# Patient Record
Sex: Male | Born: 1950 | Race: White | Hispanic: No | Marital: Married | Smoking: Former smoker
Health system: Southern US, Community
[De-identification: ages and names within clinical notes are randomized; demographics above are authoritative.]

## PROBLEM LIST (undated history)

## (undated) DIAGNOSIS — M1990 Unspecified osteoarthritis, unspecified site: Secondary | ICD-10-CM

## (undated) DIAGNOSIS — I10 Essential (primary) hypertension: Secondary | ICD-10-CM

## (undated) DIAGNOSIS — E039 Hypothyroidism, unspecified: Secondary | ICD-10-CM

## (undated) DIAGNOSIS — K219 Gastro-esophageal reflux disease without esophagitis: Secondary | ICD-10-CM

## (undated) DIAGNOSIS — K5190 Ulcerative colitis, unspecified, without complications: Secondary | ICD-10-CM

## (undated) DIAGNOSIS — T7840XA Allergy, unspecified, initial encounter: Secondary | ICD-10-CM

## (undated) DIAGNOSIS — D649 Anemia, unspecified: Secondary | ICD-10-CM

## (undated) DIAGNOSIS — C801 Malignant (primary) neoplasm, unspecified: Secondary | ICD-10-CM

## (undated) HISTORY — DX: Gastro-esophageal reflux disease without esophagitis: K21.9

## (undated) HISTORY — DX: Unspecified osteoarthritis, unspecified site: M19.90

## (undated) HISTORY — DX: Essential (primary) hypertension: I10

## (undated) HISTORY — DX: Ulcerative colitis, unspecified, without complications: K51.90

## (undated) HISTORY — DX: Allergy, unspecified, initial encounter: T78.40XA

---

## 2009-04-03 HISTORY — PX: BASAL CELL CARCINOMA EXCISION: SHX1214

## 2013-04-03 HISTORY — PX: SQUAMOUS CELL CARCINOMA EXCISION: SHX2433

## 2015-11-30 LAB — HM HEPATITIS C SCREENING LAB: HM Hepatitis Screen: NEGATIVE

## 2016-04-03 HISTORY — PX: HERNIA REPAIR: SHX51

## 2016-05-23 DIAGNOSIS — Z08 Encounter for follow-up examination after completed treatment for malignant neoplasm: Secondary | ICD-10-CM | POA: Diagnosis not present

## 2016-05-23 DIAGNOSIS — L821 Other seborrheic keratosis: Secondary | ICD-10-CM | POA: Diagnosis not present

## 2016-05-23 DIAGNOSIS — Z85828 Personal history of other malignant neoplasm of skin: Secondary | ICD-10-CM | POA: Diagnosis not present

## 2016-05-23 DIAGNOSIS — L708 Other acne: Secondary | ICD-10-CM | POA: Diagnosis not present

## 2016-05-23 DIAGNOSIS — D1801 Hemangioma of skin and subcutaneous tissue: Secondary | ICD-10-CM | POA: Diagnosis not present

## 2016-08-24 DIAGNOSIS — D125 Benign neoplasm of sigmoid colon: Secondary | ICD-10-CM | POA: Diagnosis not present

## 2016-08-24 DIAGNOSIS — K5150 Left sided colitis without complications: Secondary | ICD-10-CM | POA: Diagnosis not present

## 2016-08-24 DIAGNOSIS — K5190 Ulcerative colitis, unspecified, without complications: Secondary | ICD-10-CM | POA: Diagnosis not present

## 2016-08-24 DIAGNOSIS — K5730 Diverticulosis of large intestine without perforation or abscess without bleeding: Secondary | ICD-10-CM | POA: Diagnosis not present

## 2016-09-04 DIAGNOSIS — I10 Essential (primary) hypertension: Secondary | ICD-10-CM | POA: Diagnosis not present

## 2016-09-04 DIAGNOSIS — K4090 Unilateral inguinal hernia, without obstruction or gangrene, not specified as recurrent: Secondary | ICD-10-CM | POA: Diagnosis not present

## 2016-09-12 DIAGNOSIS — K219 Gastro-esophageal reflux disease without esophagitis: Secondary | ICD-10-CM | POA: Diagnosis not present

## 2016-09-12 DIAGNOSIS — Z880 Allergy status to penicillin: Secondary | ICD-10-CM | POA: Diagnosis not present

## 2016-09-12 DIAGNOSIS — Z800 Family history of malignant neoplasm of digestive organs: Secondary | ICD-10-CM | POA: Diagnosis not present

## 2016-09-12 DIAGNOSIS — Z833 Family history of diabetes mellitus: Secondary | ICD-10-CM | POA: Diagnosis not present

## 2016-09-12 DIAGNOSIS — Z8719 Personal history of other diseases of the digestive system: Secondary | ICD-10-CM | POA: Diagnosis not present

## 2016-09-12 DIAGNOSIS — Z91018 Allergy to other foods: Secondary | ICD-10-CM | POA: Diagnosis not present

## 2016-09-12 DIAGNOSIS — E785 Hyperlipidemia, unspecified: Secondary | ICD-10-CM | POA: Diagnosis not present

## 2016-09-12 DIAGNOSIS — Z8249 Family history of ischemic heart disease and other diseases of the circulatory system: Secondary | ICD-10-CM | POA: Diagnosis not present

## 2016-09-12 DIAGNOSIS — I10 Essential (primary) hypertension: Secondary | ICD-10-CM | POA: Diagnosis not present

## 2016-09-12 DIAGNOSIS — Z79899 Other long term (current) drug therapy: Secondary | ICD-10-CM | POA: Diagnosis not present

## 2016-09-12 DIAGNOSIS — Z85828 Personal history of other malignant neoplasm of skin: Secondary | ICD-10-CM | POA: Diagnosis not present

## 2016-09-12 DIAGNOSIS — K4090 Unilateral inguinal hernia, without obstruction or gangrene, not specified as recurrent: Secondary | ICD-10-CM | POA: Diagnosis not present

## 2016-09-12 DIAGNOSIS — Z87891 Personal history of nicotine dependence: Secondary | ICD-10-CM | POA: Diagnosis not present

## 2016-11-28 DIAGNOSIS — I10 Essential (primary) hypertension: Secondary | ICD-10-CM | POA: Diagnosis not present

## 2016-11-28 DIAGNOSIS — K5190 Ulcerative colitis, unspecified, without complications: Secondary | ICD-10-CM | POA: Diagnosis not present

## 2016-11-28 DIAGNOSIS — K219 Gastro-esophageal reflux disease without esophagitis: Secondary | ICD-10-CM | POA: Diagnosis not present

## 2017-01-01 DIAGNOSIS — H2513 Age-related nuclear cataract, bilateral: Secondary | ICD-10-CM | POA: Diagnosis not present

## 2017-01-01 DIAGNOSIS — H18413 Arcus senilis, bilateral: Secondary | ICD-10-CM | POA: Diagnosis not present

## 2017-01-01 DIAGNOSIS — H401131 Primary open-angle glaucoma, bilateral, mild stage: Secondary | ICD-10-CM | POA: Diagnosis not present

## 2017-01-01 LAB — HM DIABETES EYE EXAM

## 2017-02-08 DIAGNOSIS — Z85828 Personal history of other malignant neoplasm of skin: Secondary | ICD-10-CM | POA: Diagnosis not present

## 2017-02-08 DIAGNOSIS — D1801 Hemangioma of skin and subcutaneous tissue: Secondary | ICD-10-CM | POA: Diagnosis not present

## 2017-02-08 DIAGNOSIS — L814 Other melanin hyperpigmentation: Secondary | ICD-10-CM | POA: Diagnosis not present

## 2017-02-08 DIAGNOSIS — L570 Actinic keratosis: Secondary | ICD-10-CM | POA: Diagnosis not present

## 2017-02-08 DIAGNOSIS — L821 Other seborrheic keratosis: Secondary | ICD-10-CM | POA: Diagnosis not present

## 2017-02-08 DIAGNOSIS — Z08 Encounter for follow-up examination after completed treatment for malignant neoplasm: Secondary | ICD-10-CM | POA: Diagnosis not present

## 2017-02-08 DIAGNOSIS — L538 Other specified erythematous conditions: Secondary | ICD-10-CM | POA: Diagnosis not present

## 2017-02-08 DIAGNOSIS — Z872 Personal history of diseases of the skin and subcutaneous tissue: Secondary | ICD-10-CM | POA: Diagnosis not present

## 2017-02-08 DIAGNOSIS — L820 Inflamed seborrheic keratosis: Secondary | ICD-10-CM | POA: Diagnosis not present

## 2017-02-09 ENCOUNTER — Ambulatory Visit (INDEPENDENT_AMBULATORY_CARE_PROVIDER_SITE_OTHER): Payer: BLUE CROSS/BLUE SHIELD | Admitting: Family Medicine

## 2017-02-09 ENCOUNTER — Encounter: Payer: Self-pay | Admitting: Family Medicine

## 2017-02-09 VITALS — BP 140/96 | HR 66 | Ht 71.0 in | Wt 186.2 lb

## 2017-02-09 DIAGNOSIS — Z23 Encounter for immunization: Secondary | ICD-10-CM | POA: Diagnosis not present

## 2017-02-09 DIAGNOSIS — K51919 Ulcerative colitis, unspecified with unspecified complications: Secondary | ICD-10-CM | POA: Diagnosis not present

## 2017-02-09 DIAGNOSIS — T7840XA Allergy, unspecified, initial encounter: Secondary | ICD-10-CM

## 2017-02-09 DIAGNOSIS — K5190 Ulcerative colitis, unspecified, without complications: Secondary | ICD-10-CM | POA: Insufficient documentation

## 2017-02-09 DIAGNOSIS — Z0001 Encounter for general adult medical examination with abnormal findings: Secondary | ICD-10-CM

## 2017-02-09 DIAGNOSIS — I10 Essential (primary) hypertension: Secondary | ICD-10-CM | POA: Diagnosis not present

## 2017-02-09 DIAGNOSIS — K219 Gastro-esophageal reflux disease without esophagitis: Secondary | ICD-10-CM

## 2017-02-09 NOTE — Assessment & Plan Note (Signed)
Well-controlled.  No recent flares.

## 2017-02-09 NOTE — Assessment & Plan Note (Signed)
Slightly above goal today.  Per patient is typically well controlled.  Continue lisinopril.  Advised patient continue checking blood pressures at home and let us know if persistently elevated above 140/90.

## 2017-02-09 NOTE — Progress Notes (Signed)
Subjective:  Benjamin Mccluney. is a 66 y.o. male who presents today for his annual comprehensive physical exam and to establish care.  HPI:  Benjamin Cargile. has no acute complaints today.   Lifestyle Diet: Balanced.  Plenty of lean meats.  Has a history of ulcerative colitis and is careful on what he eats. Exercise: Walks regularly.  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  This limits his ability to be active however he does not much as he is able to.  Depression screen PHQ 2/9 02/09/2017  Decreased Interest 0  Down, Depressed, Hopeless 0  PHQ - 2 Score 0   Health Maintenance Due  Topic Date Due  . Hepatitis C Screening  04/05/1950  . TETANUS/TDAP  11/21/1969  . COLONOSCOPY  11/21/2000  . PNA vac Low Risk Adult (1 of 2 - PCV13) 11/22/2015  . INFLUENZA VACCINE  11/01/2016   ROS: A 14 point review of systems was performed and was negative  PMH:  The following were reviewed and entered/updated in epic: Past Medical History:  Diagnosis Date  . Allergy   . GERD (gastroesophageal reflux disease)   . Hypertension   . Osteoarthritis   . Ulcerative colitis [facility])    Patient Active Problem List   Diagnosis Date Noted  . Hypertension 02/09/2017  . GERD (gastroesophageal reflux disease)   . Allergy   . Ulcerative colitis [facility])    Past Surgical History:  Procedure Laterality Date  . BASAL CELL CARCINOMA EXCISION  2011  . HERNIA REPAIR  2018  . SQUAMOUS CELL CARCINOMA EXCISION  2015    No family history on file.  Medications- reviewed and updated Current Outpatient Medications  Medication Sig Dispense Refill  . carisoprodol (SOMA) 350 MG tablet TAKE 1 TABLET 3 TIMES A DAY AS NEEDED FOR MUSCLE SPASMS    . cetirizine (ZYRTEC) 10 MG tablet Take by mouth.    . esomeprazole (NEXIUM) 20 MG capsule Take 20 mg daily at 12 noon by mouth.    . esomeprazole (NEXIUM) 40 MG capsule Take 40 mg daily at 12 noon by mouth.    . Garlic 9563 MG CAPS Take by mouth.    .  Ginseng 100 MG CAPS Take by mouth.    [name] KRILL OIL PO Take 520 mg by mouth.     . latanoprost (XALATAN) 0.005 % ophthalmic solution 1 drop at bedtime.    [name] lisinopril (PRINIVIL,ZESTRIL) 10 MG tablet Take by mouth.    . mesalamine (LIALDA) 1.2 g EC tablet Take by mouth.    . Multiple Vitamins-Minerals (PRESERVISION AREDS PO) Take one tablet daily.    . niacin 500 MG tablet Take by mouth.    . Plant Sterols and Stanols 450 MG TABS Take by mouth.    . timolol (TIMOPTIC-XR) 0.5 % ophthalmic gel-forming 1 drop daily.    . Zinc Methionate 50 MG CAPS Take by mouth.     No current facility-administered medications for this visit.     Allergies-reviewed and updated Allergies  Allergen Reactions  . Penicillins     Family history only  . Strawberry Extract Hives and Itching  . Codeine Nausea Only    Social History   Socioeconomic History  . Marital status: Married    Spouse name: Not on file  . Number of children: Not on file  . Years of education: Not on file  . Highest education level: Not on file  Social Needs  . Financial  resource strain: Not on file  . Food insecurity - worry: Not on file  . Food insecurity - inability: Not on file  . Transportation needs - medical: Not on file  . Transportation needs - non-medical: Not on file  Occupational History  . Not on file  Tobacco Use  . Smoking status: Former Smoker  Substance and Sexual Activity  . Alcohol use: Yes    Comment: Occasional  . Drug use: No  . Sexual activity: Not on file  Other Topics Concern  . Not on file  Social History Narrative  . Not on file    Objective:  Physical Exam: BP (!) 140/96   Pulse 66   Ht 5\' 11"  (1.803 m)   Wt 186 lb 3.2 oz (84.5 kg)   SpO2 97%   BMI 25.97 kg/m   Body mass index is 25.97 kg/m. Gen: NAD, resting comfortably HEENT: TMs normal bilaterally. OP clear. No thyromegaly noted.  CV: RRR with no murmurs appreciated Pulm: NWOB, CTAB with no crackles, wheezes, or rhonchi GI:  Normal bowel sounds present. Soft, Nontender, Nondistended. MSK: no edema, cyanosis, or clubbing noted Skin: warm, dry Neuro: CN2-12 grossly intact. Strength 5/5 in upper and lower extremities. Reflexes symmetric and intact bilaterally.  Psych: Normal affect and thought content   Assessment/Plan:  Hypertension Slightly above goal today.  Per patient is typically well controlled.  Continue lisinopril.  Advised patient continue checking blood pressures at home and let us know if persistently elevated above 140/90.  Ulcerative colitis ([place]) Well-controlled.  No recent flares.  Preventative Healthcare: Flu shot and pneumonia shot given today.  Patient is up-to-date on his colonoscopy.  Also up-to-date on hep C screening.  We will obtain records from his prior PCP.  Check lipid panel.  Check PSA.  Patient Counseling:  -Nutrition: Stressed importance of moderation in sodium/caffeine intake, saturated fat and cholesterol, caloric balance, sufficient intake of fresh fruits, vegetables, and fiber.  -Stressed the importance of regular exercise.   -Substance Abuse: Discussed cessation/primary prevention of tobacco, alcohol, or other drug use; driving or other dangerous activities under the influence; availability of treatment for abuse.   -Injury prevention: Discussed safety belts, safety helmets, smoke detector, smoking near bedding or upholstery.   -Sexuality: Discussed sexually transmitted diseases, partner selection, use of condoms, avoidance of unintended pregnancy and contraceptive alternatives.   -Dental health: Discussed importance of regular tooth brushing, flossing, and dental visits.  -Health maintenance and immunizations reviewed. Please refer to Health maintenance section.  Return to care in 1 year for next preventative visit.   Algis Greenhouse. Jerline Pain, MD 02/09/2017 5:25 PM

## 2017-02-09 NOTE — Patient Instructions (Signed)
Schedule an appointment with Benjamin Chambers.   No changes today.  Come back to see me in 1 year, or sooner as needed   Preventive Care 66 Years and Older, Male Preventive care refers to lifestyle choices and visits with your health care provider that can promote health and wellness. What does preventive care include?  A yearly physical exam. This is also called an annual well check.  Dental exams once or twice a year.  Routine eye exams. Ask your health care provider how often you should have your eyes checked.  Personal lifestyle choices, including: ? Daily care of your teeth and gums. ? Regular physical activity. ? Eating a healthy diet. ? Avoiding tobacco and drug use. ? Limiting alcohol use. ? Practicing safe sex. ? Taking low doses of aspirin every day. ? Taking vitamin and mineral supplements as recommended by your health care provider. What happens during an annual well check? The services and screenings done by your health care provider during your annual well check will depend on your age, overall health, lifestyle risk factors, and family history of disease. Counseling Your health care provider may ask you questions about your:  Alcohol use.  Tobacco use.  Drug use.  Emotional well-being.  Home and relationship well-being.  Sexual activity.  Eating habits.  History of falls.  Memory and ability to understand (cognition).  Work and work Statistician.  Screening You may have the following tests or measurements:  Height, weight, and BMI.  Blood pressure.  Lipid and cholesterol levels. These may be checked every 5 years, or more frequently if you are over 73 years old.  Skin check.  Lung cancer screening. You may have this screening every year starting at age 22 if you have a 30-pack-year history of smoking and currently smoke or have quit within the past 15 years.  Fecal occult blood test (FOBT) of the stool. You may have this test every year starting  at age 21.  Flexible sigmoidoscopy or colonoscopy. You may have a sigmoidoscopy every 5 years or a colonoscopy every 10 years starting at age 73.  Prostate cancer screening. Recommendations will vary depending on your family history and other risks.  Hepatitis C blood test.  Hepatitis B blood test.  Sexually transmitted disease (STD) testing.  Diabetes screening. This is done by checking your blood sugar (glucose) after you have not eaten for a while (fasting). You may have this done every 1-3 years.  Abdominal aortic aneurysm (AAA) screening. You may need this if you are a current or former smoker.  Osteoporosis. You may be screened starting at age 85 if you are at high risk.  Talk with your health care provider about your test results, treatment options, and if necessary, the need for more tests. Vaccines Your health care provider may recommend certain vaccines, such as:  Influenza vaccine. This is recommended every year.  Tetanus, diphtheria, and acellular pertussis (Tdap, Td) vaccine. You may need a Td booster every 10 years.  Varicella vaccine. You may need this if you have not been vaccinated.  Zoster vaccine. You may need this after age 51.  Measles, mumps, and rubella (MMR) vaccine. You may need at least one dose of MMR if you were born in 1957 or later. You may also need a second dose.  Pneumococcal 13-valent conjugate (PCV13) vaccine. One dose is recommended after age 48.  Pneumococcal polysaccharide (PPSV23) vaccine. One dose is recommended after age 60.  Meningococcal vaccine. You may need this if you have  certain conditions.  Hepatitis A vaccine. You may need this if you have certain conditions or if you travel or work in places where you may be exposed to hepatitis A.  Hepatitis B vaccine. You may need this if you have certain conditions or if you travel or work in places where you may be exposed to hepatitis B.  Haemophilus influenzae type b (Hib) vaccine. You  may need this if you have certain risk factors.  Talk to your health care provider about which screenings and vaccines you need and how often you need them. This information is not intended to replace advice given to you by your health care provider. Make sure you discuss any questions you have with your health care provider. Document Released: 04/16/2015 Document Revised: 12/08/2015 Document Reviewed: 01/19/2015 Elsevier Interactive Patient Education  2017 [employer].

## 2017-02-10 LAB — CBC
HCT: 45.3 % (ref 38.5–50.0)
Hemoglobin: 15.4 g/dL (ref 13.2–17.1)
MCH: 28.9 pg (ref 27.0–33.0)
MCHC: 34 g/dL (ref 32.0–36.0)
MCV: 85 fL (ref 80.0–100.0)
MPV: 10.4 fL (ref 7.5–12.5)
Platelets: 311 10*3/uL (ref 140–400)
RBC: 5.33 10*6/uL (ref 4.20–5.80)
RDW: 12.9 % (ref 11.0–15.0)
WBC: 6.4 10*3/uL (ref 3.8–10.8)

## 2017-02-10 LAB — COMPREHENSIVE METABOLIC PANEL
AG Ratio: 2.1 (calc) (ref 1.0–2.5)
ALT: 24 U/L (ref 9–46)
AST: 22 U/L (ref 10–35)
Albumin: 4.6 g/dL (ref 3.6–5.1)
Alkaline phosphatase (APISO): 51 U/L (ref 40–115)
BUN: 18 mg/dL (ref 7–25)
CO2: 28 mmol/L (ref 20–32)
Calcium: 9.7 mg/dL (ref 8.6–10.3)
Chloride: 102 mmol/L (ref 98–110)
Creat: 1.02 mg/dL (ref 0.70–1.25)
Globulin: 2.2 g/dL (calc) (ref 1.9–3.7)
Glucose, Bld: 93 mg/dL (ref 65–99)
Potassium: 4.1 mmol/L (ref 3.5–5.3)
Sodium: 140 mmol/L (ref 135–146)
Total Bilirubin: 0.5 mg/dL (ref 0.2–1.2)
Total Protein: 6.8 g/dL (ref 6.1–8.1)

## 2017-02-10 LAB — LIPID PANEL
Cholesterol: 214 mg/dL — ABNORMAL HIGH (ref <200)
HDL: 39 mg/dL — ABNORMAL LOW (ref >40)
LDL Cholesterol (Calc): 131 mg/dL (calc) — ABNORMAL HIGH
Non-HDL Cholesterol (Calc): 175 mg/dL (calc) — ABNORMAL HIGH (ref <130)
Total CHOL/HDL Ratio: 5.5 (calc) — ABNORMAL HIGH (ref <5.0)
Triglycerides: 288 mg/dL — ABNORMAL HIGH (ref <150)

## 2017-02-10 LAB — PSA: PSA: 1.3 ng/mL (ref <=4.0)

## 2017-02-12 ENCOUNTER — Encounter: Payer: Self-pay | Admitting: Family Medicine

## 2017-02-12 DIAGNOSIS — E785 Hyperlipidemia, unspecified: Secondary | ICD-10-CM | POA: Insufficient documentation

## 2017-03-02 ENCOUNTER — Encounter: Payer: Self-pay | Admitting: Family Medicine

## 2017-03-08 ENCOUNTER — Encounter: Payer: Self-pay | Admitting: Family Medicine

## 2017-03-08 DIAGNOSIS — Z85828 Personal history of other malignant neoplasm of skin: Secondary | ICD-10-CM | POA: Insufficient documentation

## 2017-05-03 DIAGNOSIS — K5730 Diverticulosis of large intestine without perforation or abscess without bleeding: Secondary | ICD-10-CM | POA: Diagnosis not present

## 2017-05-03 DIAGNOSIS — K5150 Left sided colitis without complications: Secondary | ICD-10-CM | POA: Diagnosis not present

## 2017-05-03 DIAGNOSIS — D122 Benign neoplasm of ascending colon: Secondary | ICD-10-CM | POA: Diagnosis not present

## 2017-05-03 DIAGNOSIS — D123 Benign neoplasm of transverse colon: Secondary | ICD-10-CM | POA: Diagnosis not present

## 2017-05-03 DIAGNOSIS — K219 Gastro-esophageal reflux disease without esophagitis: Secondary | ICD-10-CM | POA: Diagnosis not present

## 2017-05-03 DIAGNOSIS — D124 Benign neoplasm of descending colon: Secondary | ICD-10-CM | POA: Diagnosis not present

## 2017-05-03 DIAGNOSIS — D120 Benign neoplasm of cecum: Secondary | ICD-10-CM | POA: Diagnosis not present

## 2017-05-08 ENCOUNTER — Encounter: Payer: Self-pay | Admitting: Family Medicine

## 2017-05-09 ENCOUNTER — Encounter: Payer: Self-pay | Admitting: Family Medicine

## 2017-05-10 ENCOUNTER — Other Ambulatory Visit: Payer: Self-pay

## 2017-05-10 MED ORDER — LISINOPRIL 10 MG PO TABS: 10.0000 mg | ORAL_TABLET | Freq: Every day | ORAL | 1 refills | Status: DC

## 2017-06-08 DIAGNOSIS — H2513 Age-related nuclear cataract, bilateral: Secondary | ICD-10-CM | POA: Diagnosis not present

## 2017-06-08 DIAGNOSIS — H18413 Arcus senilis, bilateral: Secondary | ICD-10-CM | POA: Diagnosis not present

## 2017-06-08 DIAGNOSIS — H25013 Cortical age-related cataract, bilateral: Secondary | ICD-10-CM | POA: Diagnosis not present

## 2017-06-08 DIAGNOSIS — H401131 Primary open-angle glaucoma, bilateral, mild stage: Secondary | ICD-10-CM | POA: Diagnosis not present

## 2017-06-22 ENCOUNTER — Ambulatory Visit (INDEPENDENT_AMBULATORY_CARE_PROVIDER_SITE_OTHER): Payer: BLUE CROSS/BLUE SHIELD | Admitting: Family Medicine

## 2017-06-22 ENCOUNTER — Encounter: Payer: Self-pay | Admitting: Family Medicine

## 2017-06-22 VITALS — BP 130/80 | HR 71 | Temp 98.3°F | Ht 71.75 in | Wt 188.0 lb

## 2017-06-22 DIAGNOSIS — C44310 Basal cell carcinoma of skin of unspecified parts of face: Secondary | ICD-10-CM | POA: Diagnosis not present

## 2017-06-22 DIAGNOSIS — M542 Cervicalgia: Secondary | ICD-10-CM

## 2017-06-22 MED ORDER — PREDNISONE 20 MG PO TABS: ORAL_TABLET | ORAL | 0 refills | Status: DC

## 2017-06-22 MED ORDER — BACLOFEN 20 MG PO TABS: 20.0000 mg | ORAL_TABLET | Freq: Three times a day (TID) | ORAL | 0 refills | Status: DC

## 2017-06-22 NOTE — Progress Notes (Signed)
    Subjective:  Benjamin Chambers. is a 67 y.o. male who presents today with a chief complaint of neck pain.   HPI:  Neck Pain, acute problem Patient with history of chronic neck and back pain.  Had a sudden worsening of his neck pain about a week ago.  Patient states that he was reaching into the refrigerator when he felt a sudden sense of pain in the upper part of his back and lower neck.  Initially had a little bit of numbness going into both of his fingers which has since subsided.  Tried using some Soma which helped a little bit, however made him feel significantly drowsy and sedated.  Still has a little bit of pain in his neck, but overall it is much better today compared to where has been in the past.  He has been doing his home exercises for his neck and back pain but does not sure if it has been helping.  Is also been trying yoga which seems to be helping.   Skin lesion, new issue Located on forehead.  Patient has history of skin cancer and is concerned he may be a basal cell carcinoma.  ROS: Per HPI  PMH: He reports that he has quit smoking. He has never used smokeless tobacco. He reports that he drinks alcohol. He reports that he does not use drugs.  Objective:  Physical Exam: BP 130/80 (BP Location: Left Arm)   Pulse 71   Temp 98.3 F (36.8 C)   Ht 5' 11.75" (1.822 m)   Wt 188 lb (85.3 kg)   SpO2 98%   BMI 25.68 kg/m   Gen: NAD, resting comfortably MSK:  -Neck:No deformities.  Mildly tender to palpation along cervical and upper thoracic paraspinal muscles.  Full range of motion in all directions. - Upper extremities: No deformities.  Strength 5 out of 5 throughout.  Biceps reflexes 2+ and symmetric bilaterally.  Sensation light touch intact and symmetric bilaterally. Skin: Approximately 4 mm hyperkeratotic lesion on forehead with rolled erythematous borders and surrounding telangiectasia.  Assessment/Plan:  Neck pain Neurovascularly intact.  No red flag signs or  symptoms.  Likely secondary to muscular strain.  We will start baclofen and a prednisone taper.  Unable to do NSAIDs given his history of ulcerative colitis and GERD.  We will also refer to physical therapy.  Recommended other conservative measures including alternating heat and ice as needed.  Return precautions reviewed.  Follow-up as needed.  Basal cell carcinoma Lesion on forehead consistent with BCC.  Will place referral to dermatology for evaluation/management.  Algis Greenhouse. Jerline Pain, MD 06/22/2017 12:35 PM

## 2017-06-28 ENCOUNTER — Encounter: Payer: Self-pay | Admitting: Physical Therapy

## 2017-06-28 ENCOUNTER — Ambulatory Visit (INDEPENDENT_AMBULATORY_CARE_PROVIDER_SITE_OTHER): Payer: BLUE CROSS/BLUE SHIELD | Admitting: Physical Therapy

## 2017-06-28 DIAGNOSIS — M542 Cervicalgia: Secondary | ICD-10-CM

## 2017-06-28 NOTE — Patient Instructions (Signed)
Scap Squeeze Chin Tucks Cervical Rotation Shoulder rolls    All 2x10, 2x/day UT stretch 30 sec x3 bil

## 2017-06-28 NOTE — Therapy (Signed)
[facility] [address] [place], [place], [address] Phone: [phone]   Fax:  [phone]  Physical Therapy Evaluation  Patient Details  Name: Benjamin Chambers. MRN: [id number] Date of Birth: 11-12-1950 Referring Provider: Dimas Chyle   Encounter Date: 06/28/2017  PT End of Session - 06/28/17 1225    Visit Number  1    Number of Visits  12    Date for PT Re-Evaluation  08/09/17    PT Start Time  1215    PT Stop Time  1257    PT Time Calculation (min)  42 min    Activity Tolerance  Patient tolerated treatment well    Behavior During Therapy  [facility] for tasks assessed/performed       Past Medical History:  Diagnosis Date  . Allergy   . GERD (gastroesophageal reflux disease)   . Hypertension   . Osteoarthritis   . Ulcerative colitis [facility])     Past Surgical History:  Procedure Laterality Date  . BASAL CELL CARCINOMA EXCISION  2011  . HERNIA REPAIR  2018  . SQUAMOUS CELL CARCINOMA EXCISION  2015    There were no vitals filed for this visit.   Subjective Assessment - 06/28/17 1218    Subjective  Pt states previous neck pain, which resolved with PT. 2 weeks ago, pt had increased pain in neck, and tingling into both hands. Pain increased since then, with addition of meds and steroid.  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 Pt denies numbness/tingling at this time.     Limitations  Reading;Sitting;Lifting;Writing;House hold activities    Currently in Pain?  Yes    Pain Score  5     Pain Location  Neck    Pain Descriptors / Indicators  Aching    Pain Type  Chronic pain    Pain Onset  More than a month ago    Pain Frequency  Intermittent    Multiple Pain Sites  No    Pain Score  0    Pain Location  --    Pain Orientation  --    Pain Descriptors / Indicators  --    Pain Onset  --    Pain Frequency  --         [facility] PT Assessment - 06/28/17 0001      Assessment   Medical Diagnosis  Neck Pain    Referring Provider  Dimas Chyle    Hand Dominance  Right    Prior Therapy  Yes      Precautions   Precautions  None      Restrictions   Weight Bearing Restrictions  No      Balance Screen   Has the patient fallen in the past 6 months  No      Prior Function   Level of Independence  Independent      Cognition   Overall Cognitive Status  Within Functional Limits for tasks assessed      Posture/Postural Control   Posture Comments  Poor seated and standing posture: rounded shoulders, forward head, lumbar posterior pelvic tilt      ROM / Strength   AROM / PROM / Strength  AROM;Strength      AROM   Overall AROM Comments  Shoulder: WNL;     AROM Assessment Site  Cervical    Cervical Flexion  wnl    Cervical Extension  wfl    Cervical - Right Rotation  58    Cervical - Left Rotation  48      Strength   Overall Strength Comments  Shoulder: 4+/5;  Scapular: 4-/5       Palpation   Palpation comment  Poor mobility of lumbar spine wiht spring testing; Negative radiculopathy for c-spine; Tenderness and tightness of rhomboids Bil; Tightness in Bil UT;               No data recorded  Objective measurements completed on examination: See above findings.      [facility] Adult PT Treatment/Exercise - 06/28/17 0001      Exercises   Exercises  Neck      Neck Exercises: Seated   Neck Retraction  20 reps    Cervical Rotation  15 reps    Shoulder Rolls  20 reps    Other Seated Exercise  Scap Squeezes x20      Neck Exercises: Stretches   Upper Trapezius Stretch  3 reps;30 seconds             PT Education - 06/28/17 1601    Education provided  Yes    Education Details  HEP,  posture, posture with computer work     [employer]) Educated  Patient    Methods  Explanation;Tactile cues;Verbal cues;Handout;Demonstration    Comprehension  Verbalized understanding;Need further instruction       PT Short Term Goals - 06/28/17 1602      PT SHORT TERM GOAL #1   Title  Pt to  be independent with initial HEP for posture     Time  3    Period  Weeks    Status  New    Target Date  07/19/17      PT SHORT TERM GOAL #2   Title  Pt to report decrease pain in upper back, to 3/10     Time  3    Period  Weeks    Status  New    Target Date  07/19/17        PT Long Term Goals - 06/28/17 1603      PT LONG TERM GOAL #1   Title  Pt to report decreased pain in uppper back , to 0-2/10 with work duties and activity     Time  6    Period  Weeks    Status  New    Target Date  08/09/17      PT LONG TERM GOAL #2   Title  Pt to demo improved cervical ROM to be WNL to improve ability for driving and IADLs.     Time  6    Period  Weeks    Status  New    Target Date  08/09/17      PT LONG TERM GOAL #3   Title  Pt to demo ability to self correct posture at least 75% of the time without cueing     Time  6    Period  Weeks    Status  New    Target Date  08/09/17             Plan - 06/28/17 1606    Clinical Impression Statement  Pt presents with primary complaint of increased pain in Upper back and cervical region. He has tightness in cervical musculature and upper traps, and  tenderness along medial scap border. He has decreased mobility of c-spine, with mild ROM loss of cervical and thoracic ROM. He has decreased postural awareness and poor  scapular strength. He will benefit from education on work set up and posture. Pain appears to be related to posture, body mechanics, muscle tightness, and cervical degeneration. Pt to benefit from skilled PT to improve deficits and pain.     Clinical Presentation  Stable    Clinical Decision Making  Low    Rehab Potential  Good    PT Frequency  2x / week    PT Duration  6 weeks    PT Treatment/Interventions  ADLs/Self Care Home Management;Cryotherapy;Electrical Stimulation;Iontophoresis 4mg /ml Dexamethasone;Moist Heat;Therapeutic exercise;Therapeutic activities;Functional mobility training;Ultrasound;Neuromuscular  re-education;Patient/family education;Manual techniques;Taping;Passive range of motion;Dry needling    PT Next Visit Plan  Posture, Manual     Consulted and Agree with Plan of Care  Patient       Patient will benefit from skilled therapeutic intervention in order to improve the following deficits and impairments:  Hypomobility, Decreased activity tolerance, Decreased strength, Pain, Increased muscle spasms, Improper body mechanics, Postural dysfunction, Impaired flexibility, Decreased range of motion  Visit Diagnosis: Neck pain     Problem List Patient Active Problem List   Diagnosis Date Noted  . History of skin cancer 03/08/2017  . Dyslipidemia 02/12/2017  . Hypertension 02/09/2017  . GERD (gastroesophageal reflux disease)   . Allergy   . Ulcerative colitis ([place])    Lyndee Hensen, PT, DPT 4:16 PM  06/28/17    [facility] [place], [place], [address] Phone: [phone]   Fax:  [phone]  Name: Benjamin Chambers. MRN: [id number] Date of Birth: Nov 23, 1950

## 2017-07-03 ENCOUNTER — Ambulatory Visit (INDEPENDENT_AMBULATORY_CARE_PROVIDER_SITE_OTHER): Payer: BLUE CROSS/BLUE SHIELD | Admitting: Physical Therapy

## 2017-07-03 ENCOUNTER — Encounter: Payer: Self-pay | Admitting: Physical Therapy

## 2017-07-03 ENCOUNTER — Telehealth: Payer: Self-pay | Admitting: Family Medicine

## 2017-07-03 DIAGNOSIS — M542 Cervicalgia: Secondary | ICD-10-CM

## 2017-07-03 NOTE — Telephone Encounter (Signed)
Can you help? Do we need to put in a new referral?  Thanks! -CMP

## 2017-07-03 NOTE — Patient Instructions (Signed)
Added supine pec stretch 30 sec x3 with nerve glides

## 2017-07-03 NOTE — Therapy (Signed)
[facility] [address] [place], [place], [address] Phone: [phone]   Fax:  [phone]  Physical Therapy Treatment  Patient Details  Name: Benjamin Chambers. MRN: [id number] Date of Birth: [birth year] Referring Provider: Dimas Chyle   Encounter Date: 07/03/2017  PT End of Session - 07/03/17 1433    Visit Number  2    Number of Visits  12    Date for PT Re-Evaluation  08/09/17    PT Start Time  4010    PT Stop Time  1345    PT Time Calculation (min)  40 min    Activity Tolerance  Patient tolerated treatment well    Behavior During Therapy  [facility] for tasks assessed/performed       Past Medical History:  Diagnosis Date  . Allergy   . GERD (gastroesophageal reflux disease)   . Hypertension   . Osteoarthritis   . Ulcerative colitis [facility])     Past Surgical History:  Procedure Laterality Date  . BASAL CELL CARCINOMA EXCISION  2011  . HERNIA REPAIR  2018  . SQUAMOUS CELL CARCINOMA EXCISION  2015    There were no vitals filed for this visit.  Subjective Assessment - 07/03/17 1416    Subjective  Pt states mild improvements in pain since last visit. He has been more conscious of his posture, has been doing HEP during the day. He also changed his chair for work to be more ergonomic. He notes less fatigue at end of the day.     Currently in Pain?  Yes    Pain Score  3     Pain Location  Neck    Pain Descriptors / Indicators  Aching    Pain Type  Chronic pain    Pain Onset  More than a month ago    Pain Frequency  Intermittent                       [facility] Adult PT Treatment/Exercise - 07/03/17 1313      Posture/Postural Control   Posture Comments  Poor seated and standing posture: rounded shoulders, forward head, lumbar posterior pelvic tilt      Exercises   Exercises  Neck      Neck Exercises: Seated   Neck Retraction  20 reps    Cervical Rotation  15 reps    Shoulder Rolls  20 reps    Other Seated  Exercise  Scap Squeezes x20; Thoracic rotation x15    Other Seated Exercise  Pelvic tilts x20      Neck Exercises: Supine   Cervical Rotation  15 reps      Neck Exercises: Stretches   Upper Trapezius Stretch  3 reps;30 seconds    Corner Stretch  3 reps;30 seconds    Corner Stretch Limitations  Doorway    Chest Stretch  2 reps    Chest Stretch Limitations  Supine with Nerve glide 2x20             PT Education - 07/03/17 1432    Education provided  Yes    Education Details  HEP     Person(s) Educated  Patient    Methods  Explanation    Comprehension  Verbalized understanding       PT Short Term Goals - 06/28/17 1602      PT SHORT TERM GOAL #1   Title  Pt to be independent with initial HEP for posture  Time  3    Period  Weeks    Status  New    Target Date  07/19/17      PT SHORT TERM GOAL #2   Title  Pt to report decrease pain in upper back, to 3/10     Time  3    Period  Weeks    Status  New    Target Date  07/19/17        PT Long Term Goals - 06/28/17 1603      PT LONG TERM GOAL #1   Title  Pt to report decreased pain in uppper back , to 0-2/10 with work duties and activity     Time  6    Period  Weeks    Status  New    Target Date  08/09/17      PT LONG TERM GOAL #2   Title  Pt to demo improved cervical ROM to be WNL to improve ability for driving and IADLs.     Time  6    Period  Weeks    Status  New    Target Date  08/09/17      PT LONG TERM GOAL #3   Title  Pt to demo ability to self correct posture at least 75% of the time without cueing     Time  6    Period  Weeks    Status  New    Target Date  08/09/17            Plan - 07/03/17 1434    Clinical Impression Statement  Mechanics for ther ex and HEP reviewed today. Ther ex for posture progressed today. Pt with no increased pain with activities. He has difficulty maintaining neutral spine posture, but has been able to self correct when cued. Plan to progress as tolerated.      Rehab Potential  Good    PT Frequency  2x / week    PT Duration  6 weeks    PT Treatment/Interventions  ADLs/Self Care Home Management;Cryotherapy;Electrical Stimulation;Iontophoresis 4mg /ml Dexamethasone;Moist Heat;Therapeutic exercise;Therapeutic activities;Functional mobility training;Ultrasound;Neuromuscular re-education;Patient/family education;Manual techniques;Taping;Passive range of motion;Dry needling    PT Next Visit Plan  Posture, Manual     Consulted and Agree with Plan of Care  Patient       Patient will benefit from skilled therapeutic intervention in order to improve the following deficits and impairments:  Hypomobility, Decreased activity tolerance, Decreased strength, Pain, Increased muscle spasms, Improper body mechanics, Postural dysfunction, Impaired flexibility, Decreased range of motion  Visit Diagnosis: Neck pain     Problem List Patient Active Problem List   Diagnosis Date Noted  . History of skin cancer 03/08/2017  . Dyslipidemia 02/12/2017  . Hypertension 02/09/2017  . GERD (gastroesophageal reflux disease)   . Allergy   . Ulcerative colitis ([place])     Lyndee Hensen, PT, DPT 2:36 PM  07/03/17   [place], [place], [address] Phone: [phone]   Fax:  [phone]  Name: Benjamin Chambers. MRN: [id number] Date of Birth: [birth year]

## 2017-07-03 NOTE — Telephone Encounter (Signed)
Please advise 

## 2017-07-03 NOTE — Telephone Encounter (Signed)
Patient would like his dermatology referral to be somewhere in [place]. I saw that the referral in Epic was for [facility] is in [place]. Please correct referral and patient would like a call with the name and number of the office.

## 2017-07-04 NOTE — Telephone Encounter (Signed)
Referral was sent to the provider that was listed when order was placed originally.  I have sent the referral to [facility] here in [place] now, they should be contacting the patient soon.

## 2017-07-04 NOTE — Telephone Encounter (Signed)
Gave pt information noted

## 2017-07-04 NOTE — Telephone Encounter (Signed)
Please update pt.  Benjamin Chambers. Jerline Pain, MD 07/04/2017 10:04 AM

## 2017-07-05 ENCOUNTER — Ambulatory Visit (INDEPENDENT_AMBULATORY_CARE_PROVIDER_SITE_OTHER): Payer: BLUE CROSS/BLUE SHIELD | Admitting: Physical Therapy

## 2017-07-05 ENCOUNTER — Encounter: Payer: Self-pay | Admitting: Physical Therapy

## 2017-07-05 DIAGNOSIS — M542 Cervicalgia: Secondary | ICD-10-CM

## 2017-07-05 NOTE — Therapy (Signed)
[facility] [address] [place], [place], [address] Phone: [phone]   Fax:  [phone]  Physical Therapy Treatment  Patient Details  Name: Benjamin Chambers. MRN: [id number] Date of Birth: 1951-02-16 Referring Provider: Dimas Chyle   Encounter Date: 07/05/2017  PT End of Session - 07/05/17 1437    Visit Number  3    Number of Visits  12    Date for PT Re-Evaluation  08/09/17    PT Start Time  1300    PT Stop Time  1343    PT Time Calculation (min)  43 min    Activity Tolerance  Patient tolerated treatment well    Behavior During Therapy  [facility] for tasks assessed/performed       Past Medical History:  Diagnosis Date  . Allergy   . GERD (gastroesophageal reflux disease)   . Hypertension   . Osteoarthritis   . Ulcerative colitis [facility])     Past Surgical History:  Procedure Laterality Date  . BASAL CELL CARCINOMA EXCISION  2011  . HERNIA REPAIR  2018  . SQUAMOUS CELL CARCINOMA EXCISION  2015    There were no vitals filed for this visit.  Subjective Assessment - 07/05/17 1436    Subjective  Pt states doing HEP. He has minimal pain in neck.     Currently in Pain?  Yes    Pain Score  1     Pain Location  Neck    Pain Descriptors / Indicators  Aching;Tightness    Pain Type  Chronic pain    Pain Onset  More than a month ago    Pain Frequency  Intermittent                       [facility] Adult PT Treatment/Exercise - 07/05/17 1305      Posture/Postural Control   Posture Comments  Poor seated and standing posture: rounded shoulders, forward head, lumbar posterior pelvic tilt      Exercises   Exercises  Neck      Neck Exercises: Standing   Other Standing Exercises  Rows/ Low Rows/ scap pull outs;  RTB x20 each      Neck Exercises: Seated   Neck Retraction  20 reps    Cervical Rotation  20 reps    Shoulder Rolls  20 reps    Other Seated Exercise  Seated fwd flexion lumbar stretch 30 sec x 3    Other  Seated Exercise  Pelvic tilts x20      Neck Exercises: Supine   Cervical Rotation  --      Manual Therapy   Manual Therapy  Joint mobilization;Soft tissue mobilization;Passive ROM;Manual Traction    Joint Mobilization  Side glides, PA mobs, grade 2-3     Passive ROM  Stretching for Bil UT    Manual Traction  10 sec x8      Neck Exercises: Stretches   Upper Trapezius Stretch  3 reps;30 seconds    Corner Stretch  3 reps;30 seconds    Corner Stretch Limitations  Doorway 2 positions    Chest Stretch  2 reps    Chest Stretch Limitations  --             PT Education - 07/05/17 1436    Education provided  Yes    Education Details  HEP     Person(s) Educated  Patient    Methods  Explanation;Verbal cues;Tactile cues;Demonstration  Comprehension  Verbalized understanding;Need further instruction;Verbal cues required;Tactile cues required       PT Short Term Goals - 06/28/17 1602      PT SHORT TERM GOAL #1   Title  Pt to be independent with initial HEP for posture     Time  3    Period  Weeks    Status  New    Target Date  07/19/17      PT SHORT TERM GOAL #2   Title  Pt to report decrease pain in upper back, to 3/10     Time  3    Period  Weeks    Status  New    Target Date  07/19/17        PT Long Term Goals - 06/28/17 1603      PT LONG TERM GOAL #1   Title  Pt to report decreased pain in uppper back , to 0-2/10 with work duties and activity     Time  6    Period  Weeks    Status  New    Target Date  08/09/17      PT LONG TERM GOAL #2   Title  Pt to demo improved cervical ROM to be WNL to improve ability for driving and IADLs.     Time  6    Period  Weeks    Status  New    Target Date  08/09/17      PT LONG TERM GOAL #3   Title  Pt to demo ability to self correct posture at least 75% of the time without cueing     Time  6    Period  Weeks    Status  New    Target Date  08/09/17            Plan - 07/05/17 1437    Clinical Impression  Statement  Strengthening for posture and scapular muscles performed today, reviewed use of pt's t-bands that he brought from home. He requires verbal and tactile cues to perform exercises correctly, and has mild difficulty with scapular retraction. He will benefit from continued practice and progression of this. Light joint mobilizations and distraction performed for pain and mobility in c-spine.     Rehab Potential  Good    PT Frequency  2x / week    PT Duration  6 weeks    PT Treatment/Interventions  ADLs/Self Care Home Management;Cryotherapy;Electrical Stimulation;Iontophoresis 4mg /ml Dexamethasone;Moist Heat;Therapeutic exercise;Therapeutic activities;Functional mobility training;Ultrasound;Neuromuscular re-education;Patient/family education;Manual techniques;Taping;Passive range of motion;Dry needling    PT Next Visit Plan  Posture, Manual     Consulted and Agree with Plan of Care  Patient       Patient will benefit from skilled therapeutic intervention in order to improve the following deficits and impairments:  Hypomobility, Decreased activity tolerance, Decreased strength, Pain, Increased muscle spasms, Improper body mechanics, Postural dysfunction, Impaired flexibility, Decreased range of motion  Visit Diagnosis: Neck pain     Problem List Patient Active Problem List   Diagnosis Date Noted  . History of skin cancer 03/08/2017  . Dyslipidemia 02/12/2017  . Hypertension 02/09/2017  . GERD (gastroesophageal reflux disease)   . Allergy   . Ulcerative colitis ([place])    Lyndee Hensen, PT, DPT 2:44 PM  07/05/17    [facility] [place], [place], [address] Phone: [phone]   Fax:  [phone]  Name: Benjamin Chambers. MRN: [id number] Date of Birth: [birth year]

## 2017-07-10 ENCOUNTER — Encounter: Payer: Self-pay | Admitting: Physical Therapy

## 2017-07-10 ENCOUNTER — Ambulatory Visit (INDEPENDENT_AMBULATORY_CARE_PROVIDER_SITE_OTHER): Payer: BLUE CROSS/BLUE SHIELD | Admitting: Physical Therapy

## 2017-07-10 DIAGNOSIS — M542 Cervicalgia: Secondary | ICD-10-CM

## 2017-07-10 NOTE — Therapy (Signed)
[facility] [address] [place], [place], [address] Phone: [phone]   Fax:  [phone]  Physical Therapy Treatment  Patient Details  Name: Benjamin Chambers. MRN: [id number] Date of Birth: [birth year] Referring Provider: Dimas Chyle   Encounter Date: 07/10/2017  PT End of Session - 07/10/17 1308    Visit Number  4    Number of Visits  12    Date for PT Re-Evaluation  08/09/17    PT Start Time  1302    PT Stop Time  1348    PT Time Calculation (min)  46 min    Activity Tolerance  Patient tolerated treatment well    Behavior During Therapy  [facility] for tasks assessed/performed       Past Medical History:  Diagnosis Date  . Allergy   . GERD (gastroesophageal reflux disease)   . Hypertension   . Osteoarthritis   . Ulcerative colitis [facility])     Past Surgical History:  Procedure Laterality Date  . BASAL CELL CARCINOMA EXCISION  2011  . HERNIA REPAIR  2018  . SQUAMOUS CELL CARCINOMA EXCISION  2015    There were no vitals filed for this visit.  Subjective Assessment - 07/10/17 1307    Subjective  Pt states increased soreness in mid back today, thinks he stretched wrong in doorway. He has also been trying different pillows to find one that is comfortable and appropriate height.     Currently in Pain?  Yes    Pain Score  2     Pain Location  Neck    Pain Descriptors / Indicators  Aching;Tightness    Pain Type  Chronic pain    Pain Onset  More than a month ago    Pain Frequency  Intermittent                       [facility] Adult PT Treatment/Exercise - 07/10/17 1323      Posture/Postural Control   Posture Comments  Poor seated and standing posture: rounded shoulders, forward head, lumbar posterior pelvic tilt      Exercises   Exercises  Neck      Neck Exercises: Standing   Other Standing Exercises  --      Neck Exercises: Seated   Neck Retraction  20 reps    Cervical Rotation  20 reps    Shoulder Rolls   20 reps    Other Seated Exercise  Seated fwd flexion lumbar stretch 30 sec x 3    Other Seated Exercise  THoracic rotation x10       Neck Exercises: Supine   Neck Retraction  10 reps    Shoulder Flexion  20 reps    Shoulder Flexion Limitations  2lb    Other Supine Exercise  SA punch x20 2lb;  Chest fly 2lb x20;       Manual Therapy   Manual Therapy  Joint mobilization;Soft tissue mobilization;Passive ROM;Manual Traction    Joint Mobilization  Side glides, PA mobs, grade 2-3     Soft tissue mobilization  STM and TPR  L UT     Passive ROM  Stretching for Bil UT, manual retraction     Manual Traction  10 sec x6       Neck Exercises: Stretches   Upper Trapezius Stretch  --    Corner Stretch  3 reps;30 seconds    Corner Stretch Limitations  Doorway 90/90    Chest Stretch  3 reps    Chest Stretch Limitations  Supine with Nerve glide 3x10              PT Education - 07/10/17 1308    Education provided  Yes    Education [employer]) Educated  Patient    Methods  Explanation;Demonstration;Tactile cues    Comprehension  Verbalized understanding;Need further instruction       PT Short Term Goals - 06/28/17 1602      PT SHORT TERM GOAL #1   Title  Pt to be independent with initial HEP for posture     Time  3    Period  Weeks    Status  New    Target Date  07/19/17      PT SHORT TERM GOAL #2   Title  Pt to report decrease pain in upper back, to 3/10     Time  3    Period  Weeks    Status  New    Target Date  07/19/17        PT Long Term Goals - 06/28/17 1603      PT LONG TERM GOAL #1   Title  Pt to report decreased pain in uppper back , to 0-2/10 with work duties and activity     Time  6    Period  Weeks    Status  New    Target Date  08/09/17      PT LONG TERM GOAL #2   Title  Pt to demo improved cervical ROM to be WNL to improve ability for driving and IADLs.     Time  6    Period  Weeks    Status  New    Target Date  08/09/17       PT LONG TERM GOAL #3   Title  Pt to demo ability to self correct posture at least 75% of the time without cueing     Time  6    Period  Weeks    Status  New    Target Date  08/09/17            Plan - 07/10/17 1411    Clinical Impression Statement  Pt continues to require cueing for mechanics with ther ex and stretches, for proper performance. He has mild increase in soreness in thoracic region,likely from pushing stretching too much, reviewed today. Pt with improving ability to self correct posture, and mild improvements in ability for cervical retraction. Scapular retraction not done today, due to soreness, but will resume next visit.     Rehab Potential  Good    PT Frequency  2x / week    PT Duration  6 weeks    PT Treatment/Interventions  ADLs/Self Care Home Management;Cryotherapy;Electrical Stimulation;Iontophoresis 4mg /ml Dexamethasone;Moist Heat;Therapeutic exercise;Therapeutic activities;Functional mobility training;Ultrasound;Neuromuscular re-education;Patient/family education;Manual techniques;Taping;Passive range of motion;Dry needling    PT Next Visit Plan  Posture, Manual     Consulted and Agree with Plan of Care  Patient       Patient will benefit from skilled therapeutic intervention in order to improve the following deficits and impairments:  Hypomobility, Decreased activity tolerance, Decreased strength, Pain, Increased muscle spasms, Improper body mechanics, Postural dysfunction, Impaired flexibility, Decreased range of motion  Visit Diagnosis: Neck pain     Problem List Patient Active Problem List   Diagnosis Date Noted  . History of skin cancer 03/08/2017  . Dyslipidemia 02/12/2017  . Hypertension  02/09/2017  . GERD (gastroesophageal reflux disease)   . Allergy   . Ulcerative colitis ([place])    Lyndee Hensen, PT, DPT 2:13 PM  07/10/17    [facility], [place], [address] Phone:  [phone]   Fax:  [phone]  Name: Benjamin Chambers. MRN: [id number] Date of Birth: 19-Sep-1950

## 2017-07-12 ENCOUNTER — Ambulatory Visit (INDEPENDENT_AMBULATORY_CARE_PROVIDER_SITE_OTHER): Payer: BLUE CROSS/BLUE SHIELD | Admitting: Physical Therapy

## 2017-07-12 DIAGNOSIS — M542 Cervicalgia: Secondary | ICD-10-CM | POA: Diagnosis not present

## 2017-07-12 NOTE — Therapy (Signed)
[facility] [address] [place], [place], [address] Phone: [phone]   Fax:  [phone]  Physical Therapy Treatment  Patient Details  Name: Benjamin Chambers. MRN: [id number] Date of Birth: 1950/09/14 Referring Provider: Dimas Chyle   Encounter Date: 07/12/2017  PT End of Session - 07/12/17 1515    Visit Number  5    Number of Visits  12    Date for PT Re-Evaluation  08/09/17    PT Start Time  1302    PT Stop Time  1346    PT Time Calculation (min)  44 min    Activity Tolerance  Patient tolerated treatment well    Behavior During Therapy  [facility] for tasks assessed/performed       Past Medical History:  Diagnosis Date  . Allergy   . GERD (gastroesophageal reflux disease)   . Hypertension   . Osteoarthritis   . Ulcerative colitis [facility])     Past Surgical History:  Procedure Laterality Date  . BASAL CELL CARCINOMA EXCISION  2011  . HERNIA REPAIR  2018  . SQUAMOUS CELL CARCINOMA EXCISION  2015    There were no vitals filed for this visit.  Subjective Assessment - 07/12/17 1511    Subjective  Pt states decreasing pain overall in neck, and while working. He is also switched pillow, and feeling better with sleep position.     Currently in Pain?  Yes    Pain Score  1     Pain Location  Neck    Pain Descriptors / Indicators  Aching;Tightness    Pain Type  Chronic pain    Pain Onset  More than a month ago    Pain Frequency  Intermittent                       [facility] Adult PT Treatment/Exercise - 07/12/17 1312      Posture/Postural Control   Posture Comments  Poor seated and standing posture: rounded shoulders, forward head, lumbar posterior pelvic tilt      Exercises   Exercises  Neck      Neck Exercises: Standing   Other Standing Exercises  Rows GTB x20 / Low Row x10 no resistance      Neck Exercises: Seated   Neck Retraction  20 reps    Cervical Rotation  20 reps    Shoulder Rolls  20 reps    Other Seated Exercise  --    Other Seated Exercise  --      Neck Exercises: Supine   Neck Retraction  10 reps    Shoulder Flexion  20 reps    Shoulder Flexion Limitations  2lb    Other Supine Exercise  SA punch x20 2lb;  Chest fly 2lb x20;       Manual Therapy   Manual Therapy  Joint mobilization;Soft tissue mobilization;Passive ROM;Manual Traction    Joint Mobilization  --    Soft tissue mobilization  STM and TPR  L UT     Passive ROM  Stretching for Bil UT, manual retraction     Manual Traction  10 sec x6       Neck Exercises: Stretches   Corner Stretch  3 reps;30 seconds    Corner Stretch Limitations  Doorway 90/90    Chest Stretch  3 reps    Chest Stretch Limitations  --             PT Education - 07/12/17  1512    Education provided  Yes    Education Details  HEP    Person(s) Educated  Patient    Methods  Explanation    Comprehension  Verbalized understanding       PT Short Term Goals - 06/28/17 1602      PT SHORT TERM GOAL #1   Title  Pt to be independent with initial HEP for posture     Time  3    Period  Weeks    Status  New    Target Date  07/19/17      PT SHORT TERM GOAL #2   Title  Pt to report decrease pain in upper back, to 3/10     Time  3    Period  Weeks    Status  New    Target Date  07/19/17        PT Long Term Goals - 06/28/17 1603      PT LONG TERM GOAL #1   Title  Pt to report decreased pain in uppper back , to 0-2/10 with work duties and activity     Time  6    Period  Weeks    Status  New    Target Date  08/09/17      PT LONG TERM GOAL #2   Title  Pt to demo improved cervical ROM to be WNL to improve ability for driving and IADLs.     Time  6    Period  Weeks    Status  New    Target Date  08/09/17      PT LONG TERM GOAL #3   Title  Pt to demo ability to self correct posture at least 75% of the time without cueing     Time  6    Period  Weeks    Status  New    Target Date  08/09/17            Plan - 07/12/17  1516    Clinical Impression Statement  Pt with less pain in neck overall as well as with work duties. He is improving with ability to perform exercises. He does have diffiuclty maintaining proper posture in shoulder/ scapula region with UE activity, and gets compensation from upper traps. He does have tightness in Upper traps , but improved from previously. He also has improving cervical ROM, and improving ability to self correct posture. Recommend continued care.     Rehab Potential  Good    PT Frequency  2x / week    PT Duration  6 weeks    PT Treatment/Interventions  ADLs/Self Care Home Management;Cryotherapy;Electrical Stimulation;Iontophoresis 4mg /ml Dexamethasone;Moist Heat;Therapeutic exercise;Therapeutic activities;Functional mobility training;Ultrasound;Neuromuscular re-education;Patient/family education;Manual techniques;Taping;Passive range of motion;Dry needling    PT Next Visit Plan  Posture, Manual     Consulted and Agree with Plan of Care  Patient       Patient will benefit from skilled therapeutic intervention in order to improve the following deficits and impairments:  Hypomobility, Decreased activity tolerance, Decreased strength, Pain, Increased muscle spasms, Improper body mechanics, Postural dysfunction, Impaired flexibility, Decreased range of motion  Visit Diagnosis: Neck pain     Problem List Patient Active Problem List   Diagnosis Date Noted  . History of skin cancer 03/08/2017  . Dyslipidemia 02/12/2017  . Hypertension 02/09/2017  . GERD (gastroesophageal reflux disease)   . Allergy   . Ulcerative colitis ([place])    Lyndee Hensen, PT, DPT 3:23 PM  07/12/17  [facility] [address] [place], [place], [address] Phone: [phone]   Fax:  [phone]  Name: Benjamin Chambers. MRN: [id number] Date of Birth: 08/06/1950

## 2017-07-17 ENCOUNTER — Ambulatory Visit (INDEPENDENT_AMBULATORY_CARE_PROVIDER_SITE_OTHER): Payer: BLUE CROSS/BLUE SHIELD | Admitting: Physical Therapy

## 2017-07-17 DIAGNOSIS — M542 Cervicalgia: Secondary | ICD-10-CM

## 2017-07-17 NOTE — Therapy (Signed)
[facility] [address] [place], [place], [address] Phone: [phone]   Fax:  [phone]  Physical Therapy Treatment  Patient Details  Name: Benjamin Chambers. MRN: [id number] Date of Birth: 1950/08/14 Referring Provider: Dimas Chyle   Encounter Date: 07/17/2017  PT End of Session - 07/17/17 1401    Visit Number  6    Number of Visits  12    Date for PT Re-Evaluation  08/09/17    PT Start Time  1304    PT Stop Time  1350    PT Time Calculation (min)  46 min    Activity Tolerance  Patient tolerated treatment well    Behavior During Therapy  [facility] for tasks assessed/performed       Past Medical History:  Diagnosis Date  . Allergy   . GERD (gastroesophageal reflux disease)   . Hypertension   . Osteoarthritis   . Ulcerative colitis [facility])     Past Surgical History:  Procedure Laterality Date  . BASAL CELL CARCINOMA EXCISION  2011  . HERNIA REPAIR  2018  . SQUAMOUS CELL CARCINOMA EXCISION  2015    There were no vitals filed for this visit.  Subjective Assessment - 07/17/17 1401    Subjective  Pt states decreased pain overall, he is happy with progress he is making, has been diligent with HEP.     Currently in Pain?  No/denies    Pain Score  0-No pain                       [facility] Adult PT Treatment/Exercise - 07/17/17 1305      Posture/Postural Control   Posture Comments  --      Exercises   Exercises  Neck      Neck Exercises: Standing   Other Standing Exercises  Rows GTB x20 / Low Row x15 no resistance    Other Standing Exercises  UE scaption and ABD x15 each, with foam roll at wall for posture      Neck Exercises: Seated   Neck Retraction  20 reps    Cervical Rotation  --    Shoulder Rolls  --    Other Seated Exercise  Median N. Glides x20 bil;       Neck Exercises: Supine   Neck Retraction  --    Shoulder Flexion  20 reps    Shoulder Flexion Limitations  AAROIM/cane , and with 2lb    Other Supine Exercise  SA punch x20 2lb;  Chest fly 2lb x20;       Manual Therapy   Manual Therapy  Joint mobilization;Soft tissue mobilization;Passive ROM;Manual Traction    Soft tissue mobilization  STM and TPR  L UT     Passive ROM  Stretching for Bil UT, manual retraction , rotation, and extension    Manual Traction  10 sec x6       Neck Exercises: Stretches   Corner Stretch  3 reps;30 seconds    Corner Stretch Limitations  Doorway 90/90    Chest Stretch  3 reps             PT Education - 07/17/17 1401    Education provided  Yes    Education Details  posture, HEP, yoga modifications     Person(s) Educated  Patient    Methods  Explanation    Comprehension  Verbalized understanding       PT Short Term Goals - 06/28/17  [place] #1   Title  Pt to be independent with initial HEP for posture     Time  3    Period  Weeks    Status  New    Target Date  07/19/17      PT SHORT TERM GOAL #2   Title  Pt to report decrease pain in upper back, to 3/10     Time  3    Period  Weeks    Status  New    Target Date  07/19/17        PT Long Term Goals - 06/28/17 1603      PT LONG TERM GOAL #1   Title  Pt to report decreased pain in uppper back , to 0-2/10 with work duties and activity     Time  6    Period  Weeks    Status  New    Target Date  08/09/17      PT LONG TERM GOAL #2   Title  Pt to demo improved cervical ROM to be WNL to improve ability for driving and IADLs.     Time  6    Period  Weeks    Status  New    Target Date  08/09/17      PT LONG TERM GOAL #3   Title  Pt to demo ability to self correct posture at least 75% of the time without cueing     Time  6    Period  Weeks    Status  New    Target Date  08/09/17            Plan - 07/17/17 1408    Clinical Impression Statement  Pt improving with pain in neck. He has improving cervical ROM. He is mildly limited with full shoulder ROM in standing, given stretches and ROM for  this today, also likely due to thoracic limitations. Plan to continue strengthening and ROM progressions, possible d/c next week.     Rehab Potential  Good    PT Frequency  2x / week    PT Duration  6 weeks    PT Treatment/Interventions  ADLs/Self Care Home Management;Cryotherapy;Electrical Stimulation;Iontophoresis 4mg /ml Dexamethasone;Moist Heat;Therapeutic exercise;Therapeutic activities;Functional mobility training;Ultrasound;Neuromuscular re-education;Patient/family education;Manual techniques;Taping;Passive range of motion;Dry needling    PT Next Visit Plan  Posture, Manual     Consulted and Agree with Plan of Care  Patient       Patient will benefit from skilled therapeutic intervention in order to improve the following deficits and impairments:  Hypomobility, Decreased activity tolerance, Decreased strength, Pain, Increased muscle spasms, Improper body mechanics, Postural dysfunction, Impaired flexibility, Decreased range of motion  Visit Diagnosis: Neck pain     Problem List Patient Active Problem List   Diagnosis Date Noted  . History of skin cancer 03/08/2017  . Dyslipidemia 02/12/2017  . Hypertension 02/09/2017  . GERD (gastroesophageal reflux disease)   . Allergy   . Ulcerative colitis ([place])    Lyndee Hensen, PT, DPT 2:09 PM  07/17/17    [place], [place], [address] Phone: [phone]   Fax:  [phone]  Name: Benjamin Chambers. MRN: [id number] Date of Birth: 1950/06/03

## 2017-07-19 ENCOUNTER — Encounter: Payer: Self-pay | Admitting: Physical Therapy

## 2017-07-19 ENCOUNTER — Ambulatory Visit (INDEPENDENT_AMBULATORY_CARE_PROVIDER_SITE_OTHER): Payer: BLUE CROSS/BLUE SHIELD | Admitting: Physical Therapy

## 2017-07-19 DIAGNOSIS — M542 Cervicalgia: Secondary | ICD-10-CM | POA: Diagnosis not present

## 2017-07-19 NOTE — Therapy (Signed)
[facility] [address] [place], [place], [address] Phone: [phone]   Fax:  [phone]  Physical Therapy Treatment  Patient Details  Name: Benjamin Chambers. MRN: [id number] Date of Birth: Aug 06, 1950 Referring Provider: Dimas Chyle   Encounter Date: 07/19/2017  PT End of Session - 07/19/17 1409    Visit Number  7    Number of Visits  12    Date for PT Re-Evaluation  08/09/17    PT Start Time  1302    PT Stop Time  1344    PT Time Calculation (min)  42 min    Activity Tolerance  Patient tolerated treatment well    Behavior During Therapy  [facility] for tasks assessed/performed       Past Medical History:  Diagnosis Date  . Allergy   . GERD (gastroesophageal reflux disease)   . Hypertension   . Osteoarthritis   . Ulcerative colitis [facility])     Past Surgical History:  Procedure Laterality Date  . BASAL CELL CARCINOMA EXCISION  2011  . HERNIA REPAIR  2018  . SQUAMOUS CELL CARCINOMA EXCISION  2015    There were no vitals filed for this visit.  Subjective Assessment - 07/19/17 1408    Subjective  Pt states continued improvements, little pain.     Currently in Pain?  No/denies    Pain Score  0-No pain                       [facility] Adult PT Treatment/Exercise - 07/19/17 1317      Exercises   Exercises  Neck      Neck Exercises: Standing   Other Standing Exercises  Rows GTB x20 / Low Row x15 no resistance    Other Standing Exercises  UE scaption and ABD x15 each 1 lb,  with foam roll at wall for posture      Neck Exercises: Seated   Neck Retraction  20 reps    Other Seated Exercise  Median N. Glides x20 bil;     Other Seated Exercise  pulley x20      Neck Exercises: Supine   Neck Retraction  10 reps    Shoulder Flexion  --    Shoulder Flexion Limitations  --    Other Supine Exercise  SA punch x20 2lb;  Chest fly 2lb x20;       Neck Exercises: Sidelying   Other Sidelying Exercise  Shoulder Abd x15  bil'      Manual Therapy   Manual Therapy  Joint mobilization;Soft tissue mobilization;Passive ROM;Manual Traction    Joint Mobilization  Side glides, PA mobs, grade 2-3     Soft tissue mobilization  sub occipital release     Passive ROM  Stretching for Bil UT, manual retraction    Manual Traction  10 sec x6       Neck Exercises: Stretches   Corner Stretch  3 reps;30 seconds    Corner Stretch Limitations  Doorway 90/90    Chest Stretch  3 reps    Chest Stretch Limitations  Supine with Nerve glide 3x10              PT Education - 07/19/17 1408    Education Details  HEP    Person(s) Educated  Patient    Methods  Explanation    Comprehension  Verbalized understanding;Need further instruction       PT Short Term Goals - 06/28/17 8250  PT SHORT TERM GOAL #1   Title  Pt to be independent with initial HEP for posture     Time  3    Period  Weeks    Status  New    Target Date  07/19/17      PT SHORT TERM GOAL #2   Title  Pt to report decrease pain in upper back, to 3/10     Time  3    Period  Weeks    Status  New    Target Date  07/19/17        PT Long Term Goals - 06/28/17 1603      PT LONG TERM GOAL #1   Title  Pt to report decreased pain in uppper back , to 0-2/10 with work duties and activity     Time  6    Period  Weeks    Status  New    Target Date  08/09/17      PT LONG TERM GOAL #2   Title  Pt to demo improved cervical ROM to be WNL to improve ability for driving and IADLs.     Time  6    Period  Weeks    Status  New    Target Date  08/09/17      PT LONG TERM GOAL #3   Title  Pt to demo ability to self correct posture at least 75% of the time without cueing     Time  6    Period  Weeks    Status  New    Target Date  08/09/17            Plan - 07/19/17 1419    Clinical Impression Statement  Pt showing good improvements. He has improved with cervical ROM and ability to self correct posture. He has been able to progress UE ROM, but  requires much cuing for mechanics and to decrease compensation from upper traps. Plan to review final HEP next week, and work towards d/c .     Rehab Potential  Good    PT Frequency  2x / week    PT Duration  6 weeks    PT Treatment/Interventions  ADLs/Self Care Home Management;Cryotherapy;Electrical Stimulation;Iontophoresis 4mg /ml Dexamethasone;Moist Heat;Therapeutic exercise;Therapeutic activities;Functional mobility training;Ultrasound;Neuromuscular re-education;Patient/family education;Manual techniques;Taping;Passive range of motion;Dry needling    PT Next Visit Plan  Posture, Manual     Consulted and Agree with Plan of Care  Patient       Patient will benefit from skilled therapeutic intervention in order to improve the following deficits and impairments:  Hypomobility, Decreased activity tolerance, Decreased strength, Pain, Increased muscle spasms, Improper body mechanics, Postural dysfunction, Impaired flexibility, Decreased range of motion  Visit Diagnosis: Neck pain     Problem List Patient Active Problem List   Diagnosis Date Noted  . History of skin cancer 03/08/2017  . Dyslipidemia 02/12/2017  . Hypertension 02/09/2017  . GERD (gastroesophageal reflux disease)   . Allergy   . Ulcerative colitis ([place])    Lyndee Hensen, PT, DPT 2:21 PM  07/19/17    [place], [place], [address] Phone: [phone]   Fax:  [phone]  Name: Benjamin Chambers. MRN: [id number] Date of Birth: 01/12/1951

## 2017-07-24 ENCOUNTER — Encounter: Payer: Self-pay | Admitting: Physical Therapy

## 2017-07-24 ENCOUNTER — Ambulatory Visit (INDEPENDENT_AMBULATORY_CARE_PROVIDER_SITE_OTHER): Payer: BLUE CROSS/BLUE SHIELD | Admitting: Physical Therapy

## 2017-07-24 DIAGNOSIS — M542 Cervicalgia: Secondary | ICD-10-CM

## 2017-07-24 NOTE — Therapy (Signed)
[facility] [address] [place], [place], [address] Phone: [phone]   Fax:  [phone]  Physical Therapy Treatment  Patient Details  Name: Benjamin Chambers. MRN: [id number] Date of Birth: 06/07/1950 Referring Provider: Dimas Chyle   Encounter Date: 07/24/2017  PT End of Session - 07/24/17 1412    Visit Number  8    Number of Visits  12    Date for PT Re-Evaluation  08/09/17    PT Start Time  1245    PT Stop Time  1350    PT Time Calculation (min)  45 min    Activity Tolerance  Patient tolerated treatment well    Behavior During Therapy  [facility] for tasks assessed/performed       Past Medical History:  Diagnosis Date  . Allergy   . GERD (gastroesophageal reflux disease)   . Hypertension   . Osteoarthritis   . Ulcerative colitis [facility])     Past Surgical History:  Procedure Laterality Date  . BASAL CELL CARCINOMA EXCISION  2011  . HERNIA REPAIR  2018  . SQUAMOUS CELL CARCINOMA EXCISION  2015    There were no vitals filed for this visit.  Subjective Assessment - 07/24/17 1410    Subjective  P with no new complaints. He hnotices that he has been able to "tell when he is hunched over", and correct it     Currently in Pain?  No/denies    Pain Score  0-No pain                       [facility] Adult PT Treatment/Exercise - 07/24/17 1308      Exercises   Exercises  Neck      Neck Exercises: Standing   Other Standing Exercises  Rows GTB x20    Other Standing Exercises  UE scaption and ABD x15 each 2 lb,  with foam roll at wall for posture      Neck Exercises: Seated   Neck Retraction  20 reps    Other Seated Exercise  Median N. Glides x20 bil;     Other Seated Exercise  pulley x20      Neck Exercises: Supine   Neck Retraction  --    Other Supine Exercise  SA punch x20 2lb;  Chest fly 2lb x20;       Neck Exercises: Sidelying   Other Sidelying Exercise  Shoulder Abd x20  bil'      Manual Therapy   Manual Therapy  Joint mobilization;Soft tissue mobilization;Passive ROM;Manual Traction    Joint Mobilization  Side glides, PA mobs, grade 2-3     Soft tissue mobilization  --    Passive ROM  Stretching for Bil UT, manual retraction    Manual Traction  10 sec x 10       Neck Exercises: Stretches   Corner Stretch  3 reps;30 seconds    Corner Stretch Limitations  Doorway 90/90    Chest Stretch  3 reps    Chest Stretch Limitations  Supine with Nerve glide 3x10              PT Education - 07/24/17 1411    Education provided  Yes    Education Details  final HEP    Person(s) Educated  Patient    Methods  Explanation;Demonstration;Verbal cues;Handout    Comprehension  Verbalized understanding;Returned demonstration;Verbal cues required       PT Short Term Goals - 06/28/17  [place] #1   Title  Pt to be independent with initial HEP for posture     Time  3    Period  Weeks    Status  New    Target Date  07/19/17      PT SHORT TERM GOAL #2   Title  Pt to report decrease pain in upper back, to 3/10     Time  3    Period  Weeks    Status  New    Target Date  07/19/17        PT Long Term Goals - 06/28/17 1603      PT LONG TERM GOAL #1   Title  Pt to report decreased pain in uppper back , to 0-2/10 with work duties and activity     Time  6    Period  Weeks    Status  New    Target Date  08/09/17      PT LONG TERM GOAL #2   Title  Pt to demo improved cervical ROM to be WNL to improve ability for driving and IADLs.     Time  6    Period  Weeks    Status  New    Target Date  08/09/17      PT LONG TERM GOAL #3   Title  Pt to demo ability to self correct posture at least 75% of the time without cueing     Time  6    Period  Weeks    Status  New    Target Date  08/09/17            Plan - 07/24/17 1412    Clinical Impression Statement  Pt making good progress with posture, and pain. Final HEP reviewed today, handouts given. Pt requires mild  verbal cueing for correct performance and mechanics with exercies. Plan to review again next session, and d/c. Pt in agreement wtih plan .     Rehab Potential  Good    PT Frequency  2x / week    PT Duration  6 weeks    PT Treatment/Interventions  ADLs/Self Care Home Management;Cryotherapy;Electrical Stimulation;Iontophoresis 4mg /ml Dexamethasone;Moist Heat;Therapeutic exercise;Therapeutic activities;Functional mobility training;Ultrasound;Neuromuscular re-education;Patient/family education;Manual techniques;Taping;Passive range of motion;Dry needling    PT Next Visit Plan  Posture, Manual     Consulted and Agree with Plan of Care  Patient       Patient will benefit from skilled therapeutic intervention in order to improve the following deficits and impairments:  Hypomobility, Decreased activity tolerance, Decreased strength, Pain, Increased muscle spasms, Improper body mechanics, Postural dysfunction, Impaired flexibility, Decreased range of motion  Visit Diagnosis: Neck pain     Problem List Patient Active Problem List   Diagnosis Date Noted  . History of skin cancer 03/08/2017  . Dyslipidemia 02/12/2017  . Hypertension 02/09/2017  . GERD (gastroesophageal reflux disease)   . Allergy   . Ulcerative colitis ([place])     Lyndee Hensen, PT, DPT 2:23 PM  07/24/17   [facility] [place], [place], [address] Phone: [phone]   Fax:  [phone]  Name: Benjamin Chambers. MRN: [id number] Date of Birth: 1950-07-01

## 2017-07-26 ENCOUNTER — Ambulatory Visit (INDEPENDENT_AMBULATORY_CARE_PROVIDER_SITE_OTHER): Payer: BLUE CROSS/BLUE SHIELD | Admitting: Physical Therapy

## 2017-07-26 ENCOUNTER — Encounter: Payer: Self-pay | Admitting: Physical Therapy

## 2017-07-26 DIAGNOSIS — M542 Cervicalgia: Secondary | ICD-10-CM | POA: Diagnosis not present

## 2017-07-26 NOTE — Therapy (Signed)
[facility] [address] [place], [place], [address] Phone: [phone]   Fax:  [phone]  Physical Therapy Treatment/Discharge  Patient Details  Name: Benjamin Chambers. MRN: [id number] Date of Birth: June 09, 1950 Referring Provider: Dimas Chyle   Encounter Date: 07/26/2017  PT End of Session - 07/26/17 1346    Visit Number  9    Number of Visits  12    Date for PT Re-Evaluation  08/09/17    PT Start Time  1300    PT Stop Time  1340    PT Time Calculation (min)  40 min    Activity Tolerance  Patient tolerated treatment well    Behavior During Therapy  [facility] for tasks assessed/performed       Past Medical History:  Diagnosis Date  . Allergy   . GERD (gastroesophageal reflux disease)   . Hypertension   . Osteoarthritis   . Ulcerative colitis [facility])     Past Surgical History:  Procedure Laterality Date  . BASAL CELL CARCINOMA EXCISION  2011  . HERNIA REPAIR  2018  . SQUAMOUS CELL CARCINOMA EXCISION  2015    There were no vitals filed for this visit.  Subjective Assessment - 07/26/17 1345    Subjective  Pt states no pain in several days. He has been alble to do HEP and feels he is overall having less pain and stiffness in neck and back in the mornings.     Currently in Pain?  No/denies    Pain Score  0-No pain         [facility] PT Assessment - 07/26/17 0001      AROM   Cervical - Right Rotation  60    Cervical - Left Rotation  60      Strength   Overall Strength Comments  Shoulder: 4+/5;  Scapular: 4+/5                    [facility] Adult PT Treatment/Exercise - 07/26/17 1304      Exercises   Exercises  Neck      Neck Exercises: Standing   Other Standing Exercises  Rows GTB x20    Other Standing Exercises  UE scaption and ABD x15 each 2 lb,  at wall for posture      Neck Exercises: Seated   Neck Retraction  20 reps    Cervical Rotation  10 reps    Other Seated Exercise  Median N. Glides x20 bil;      Other Seated Exercise  pulley x20      Neck Exercises: Supine   Other Supine Exercise  SA punch x20 3lb;  Chest fly 3lb x20;       Neck Exercises: Sidelying   Other Sidelying Exercise  Shoulder Abd x20  bil'      Manual Therapy   Manual Therapy  Joint mobilization;Soft tissue mobilization;Passive ROM;Manual Traction    Joint Mobilization  --    Passive ROM  --    Manual Traction  --      Neck Exercises: Stretches   Corner Stretch  3 reps;30 seconds    Corner Stretch Limitations  Doorway 90/90    Chest Stretch  3 reps    Chest Stretch Limitations  Supine with Nerve glide 3x10              PT Education - 07/26/17 1346    Education provided  Yes    Education Details  Final HEP  Person(s) Educated  Patient    Methods  Explanation;Demonstration;Tactile cues;Verbal cues    Comprehension  Verbalized understanding;Returned demonstration       PT Short Term Goals - 07/26/17 1346      PT SHORT TERM GOAL #1   Title  Pt to be independent with initial HEP for posture     Time  3    Period  Weeks    Status  Achieved      PT SHORT TERM GOAL #2   Title  Pt to report decrease pain in upper back, to 3/10     Time  3    Period  Weeks    Status  Achieved        PT Long Term Goals - 07/26/17 1347      PT LONG TERM GOAL #1   Title  Pt to report decreased pain in uppper back , to 0-2/10 with work duties and activity     Time  6    Period  Weeks    Status  Achieved      PT LONG TERM GOAL #2   Title  Pt to demo improved cervical ROM to be WNL to improve ability for driving and IADLs.     Time  6    Period  Weeks    Status  Achieved      PT LONG TERM GOAL #3   Title  Pt to demo ability to self correct posture at least 75% of the time without cueing     Time  6    Period  Weeks    Status  Achieved            Plan - 07/26/17 1347    Clinical Impression Statement  Pt with improved cervical ROM, he has very mild restrictions that are [facility] for pt age. He has much  improved mobility for shoulders and neck, with improved posture, and ability to self correct posture. Pt independent with final HEP for ROM and strength of cervical region and shoulders. He has had good pain relief, and reports no pain at this time. Pt has met goals and is ready for d/c to HEP. Pt in agreement with plan.     Rehab Potential  Good    PT Frequency  2x / week    PT Duration  6 weeks    PT Treatment/Interventions  ADLs/Self Care Home Management;Cryotherapy;Electrical Stimulation;Iontophoresis 41m/ml Dexamethasone;Moist Heat;Therapeutic exercise;Therapeutic activities;Functional mobility training;Ultrasound;Neuromuscular re-education;Patient/family education;Manual techniques;Taping;Passive range of motion;Dry needling    PT Next Visit Plan  Posture, Manual     Consulted and Agree with Plan of Care  Patient       Patient will benefit from skilled therapeutic intervention in order to improve the following deficits and impairments:  Hypomobility, Decreased activity tolerance, Decreased strength, Pain, Increased muscle spasms, Improper body mechanics, Postural dysfunction, Impaired flexibility, Decreased range of motion  Visit Diagnosis: Neck pain     Problem List Patient Active Problem List   Diagnosis Date Noted  . History of skin cancer 03/08/2017  . Dyslipidemia 02/12/2017  . Hypertension 02/09/2017  . GERD (gastroesophageal reflux disease)   . Allergy   . Ulcerative colitis ([place]    LLyndee Hensen PT, DPT 1:50 PM  07/26/17    [place] [address]Phone: [phone]  Fax:  [phone] Name: Benjamin Chambers MRN: [id number] of Birth: 8Jul 01, 1952 PHYSICAL THERAPY DISCHARGE SUMMARY  Visits  from Start of Care: 9  Plan: Patient agrees to discharge.  Patient goals were met. Patient is being discharged due to meeting the stated rehab goals.  ?????      Lyndee Hensen, PT,  DPT 1:51 PM  07/26/17

## 2017-08-23 DIAGNOSIS — L821 Other seborrheic keratosis: Secondary | ICD-10-CM | POA: Diagnosis not present

## 2017-08-23 DIAGNOSIS — L570 Actinic keratosis: Secondary | ICD-10-CM | POA: Diagnosis not present

## 2017-08-23 DIAGNOSIS — L219 Seborrheic dermatitis, unspecified: Secondary | ICD-10-CM | POA: Diagnosis not present

## 2017-09-27 DIAGNOSIS — L570 Actinic keratosis: Secondary | ICD-10-CM | POA: Diagnosis not present

## 2017-09-27 DIAGNOSIS — L219 Seborrheic dermatitis, unspecified: Secondary | ICD-10-CM | POA: Diagnosis not present

## 2017-09-27 DIAGNOSIS — B078 Other viral warts: Secondary | ICD-10-CM | POA: Diagnosis not present

## 2017-10-31 ENCOUNTER — Telehealth: Payer: Self-pay | Admitting: Family Medicine

## 2017-10-31 NOTE — Telephone Encounter (Signed)
Copied from [place] [id number]. Topic: Quick Communication - See Telephone Encounter >> Oct 31, 2017 12:04 PM Vernona Rieger wrote: CRM for notification. See Telephone encounter for: 10/31/17. Patient's wife called and said that his insurance would not pay for his physical therapy visits ( last two visits ). She called the the billing dept and they said that the claim could not be submitted with a back dated referral date .She said that the referral got to medicare before it went to [employer]. If you have any questions call her at [phone] Arbie Cookey, wife)

## 2017-11-01 NOTE — Telephone Encounter (Signed)
Forwarding

## 2017-11-06 ENCOUNTER — Other Ambulatory Visit: Payer: Self-pay | Admitting: Family Medicine

## 2017-11-27 ENCOUNTER — Ambulatory Visit: Payer: Medicare Other

## 2017-11-29 DIAGNOSIS — K222 Esophageal obstruction: Secondary | ICD-10-CM | POA: Diagnosis not present

## 2017-11-29 DIAGNOSIS — K219 Gastro-esophageal reflux disease without esophagitis: Secondary | ICD-10-CM | POA: Diagnosis not present

## 2017-11-29 DIAGNOSIS — K5190 Ulcerative colitis, unspecified, without complications: Secondary | ICD-10-CM | POA: Diagnosis not present

## 2017-12-21 DIAGNOSIS — H31011 Macula scars of posterior pole (postinflammatory) (post-traumatic), right eye: Secondary | ICD-10-CM | POA: Diagnosis not present

## 2017-12-21 DIAGNOSIS — H2513 Age-related nuclear cataract, bilateral: Secondary | ICD-10-CM | POA: Diagnosis not present

## 2017-12-21 DIAGNOSIS — H401131 Primary open-angle glaucoma, bilateral, mild stage: Secondary | ICD-10-CM | POA: Diagnosis not present

## 2017-12-27 ENCOUNTER — Ambulatory Visit (INDEPENDENT_AMBULATORY_CARE_PROVIDER_SITE_OTHER): Payer: BLUE CROSS/BLUE SHIELD

## 2017-12-27 DIAGNOSIS — Z23 Encounter for immunization: Secondary | ICD-10-CM

## 2017-12-28 ENCOUNTER — Telehealth: Payer: Self-pay

## 2017-12-28 NOTE — Telephone Encounter (Signed)
Copied from [place] [id number]. Topic: Quick Communication - Office Called Patient >> Dec 25, 2017 11:55 AM Jasper Loser, CMA wrote: Reason for CRM: Called pt regarding denied claim for PT .   I am requesting that pt bring in or fax [phone]) a copy of the denial letter and any other documentation pertinent to the claim and/or claim denial. >> Dec 25, 2017 12:04 PM Sheran Luz wrote: Pt made appointment on 9/26 for flu shot and will bring paperwork then.  >> Dec 28, 2017 11:13 AM Jasper Loser, CMA wrote: Information received. Contacted insurance, advised to submit request for retroactive PA. Form faxed. Also advised by insurance that pt has 6 visit lifetime limit on PT which has been met. Any additional PT visit will require PA and/or documentation/care plan to be sent in for review.

## 2018-01-08 NOTE — Telephone Encounter (Signed)
Called [employer] and left VM to call me back regarding a retroactive PA request.

## 2018-01-10 NOTE — Telephone Encounter (Signed)
Appeal letter, OV notes, pt appeal letter and denial letter, and a copy of claims have been mailed to [employer].   Advised by insurance rep that retroactive PA cannot be done because the visit was too long ago.

## 2018-01-10 NOTE — Telephone Encounter (Signed)
Forwarding to [employer] at [employer]

## 2018-01-11 NOTE — Telephone Encounter (Signed)
Noted  

## 2018-02-06 DIAGNOSIS — L821 Other seborrheic keratosis: Secondary | ICD-10-CM | POA: Diagnosis not present

## 2018-02-06 DIAGNOSIS — L570 Actinic keratosis: Secondary | ICD-10-CM | POA: Diagnosis not present

## 2018-02-06 DIAGNOSIS — L218 Other seborrheic dermatitis: Secondary | ICD-10-CM | POA: Diagnosis not present

## 2018-02-06 DIAGNOSIS — L820 Inflamed seborrheic keratosis: Secondary | ICD-10-CM | POA: Diagnosis not present

## 2018-02-06 DIAGNOSIS — L298 Other pruritus: Secondary | ICD-10-CM | POA: Diagnosis not present

## 2018-02-06 DIAGNOSIS — D1801 Hemangioma of skin and subcutaneous tissue: Secondary | ICD-10-CM | POA: Diagnosis not present

## 2018-02-22 ENCOUNTER — Other Ambulatory Visit: Payer: Self-pay | Admitting: Family Medicine

## 2018-02-25 NOTE — Telephone Encounter (Signed)
See note

## 2018-02-25 NOTE — Telephone Encounter (Signed)
Forwarding to [employer]  Attempted retroactive request, advised that could not be done. Also sent appeal letter, never heard back. This will need to be addressed by Dr. Jerline Pain and his team.

## 2018-02-25 NOTE — Telephone Encounter (Signed)
Pt's wife calling in.  States she was under the impression that this was taken care of.  States that they have now gotten a collections letter from [facility] that states that they are in collections.

## 2018-02-26 NOTE — Telephone Encounter (Signed)
Forwarding

## 2018-02-27 NOTE — Telephone Encounter (Signed)
I have emailed charge corrections to assist with the next steps needed to resolve this issue for the patient. I am awaiting a call back to advise.  I have also contacted the patient to give them an update on what is occurring.

## 2018-02-27 NOTE — Telephone Encounter (Signed)
Please call patient and email charge corrections to see if there is anything else that we can do.

## 2018-05-04 ENCOUNTER — Other Ambulatory Visit: Payer: Self-pay | Admitting: Family Medicine

## 2018-05-06 DIAGNOSIS — K219 Gastro-esophageal reflux disease without esophagitis: Secondary | ICD-10-CM | POA: Diagnosis not present

## 2018-05-06 DIAGNOSIS — K5150 Left sided colitis without complications: Secondary | ICD-10-CM | POA: Diagnosis not present

## 2018-05-06 DIAGNOSIS — Z86010 Personal history of colonic polyps: Secondary | ICD-10-CM | POA: Diagnosis not present

## 2018-05-06 DIAGNOSIS — R1310 Dysphagia, unspecified: Secondary | ICD-10-CM | POA: Diagnosis not present

## 2018-05-06 LAB — HM COLONOSCOPY

## 2018-05-10 ENCOUNTER — Encounter: Payer: Self-pay | Admitting: Family Medicine

## 2018-07-17 ENCOUNTER — Telehealth: Payer: Self-pay | Admitting: Family Medicine

## 2018-07-17 NOTE — Telephone Encounter (Signed)
Pt has not been seen in a year. Called pt and informed of doxy. Pt stated he did not need appt at this time

## 2018-09-26 DIAGNOSIS — H31011 Macula scars of posterior pole (postinflammatory) (post-traumatic), right eye: Secondary | ICD-10-CM | POA: Diagnosis not present

## 2018-09-26 DIAGNOSIS — H401131 Primary open-angle glaucoma, bilateral, mild stage: Secondary | ICD-10-CM | POA: Diagnosis not present

## 2018-09-26 DIAGNOSIS — H2513 Age-related nuclear cataract, bilateral: Secondary | ICD-10-CM | POA: Diagnosis not present

## 2018-09-26 DIAGNOSIS — H25013 Cortical age-related cataract, bilateral: Secondary | ICD-10-CM | POA: Diagnosis not present

## 2018-11-02 ENCOUNTER — Other Ambulatory Visit: Payer: Self-pay | Admitting: Family Medicine

## 2018-11-26 ENCOUNTER — Ambulatory Visit (INDEPENDENT_AMBULATORY_CARE_PROVIDER_SITE_OTHER): Payer: BC Managed Care – PPO | Admitting: Family Medicine

## 2018-11-26 ENCOUNTER — Other Ambulatory Visit: Payer: Self-pay

## 2018-11-26 ENCOUNTER — Encounter: Payer: Self-pay | Admitting: Family Medicine

## 2018-11-26 VITALS — BP 153/103 | HR 69 | Temp 98.3°F | Ht 71.5 in | Wt 188.0 lb

## 2018-11-26 DIAGNOSIS — I10 Essential (primary) hypertension: Secondary | ICD-10-CM

## 2018-11-26 DIAGNOSIS — M549 Dorsalgia, unspecified: Secondary | ICD-10-CM

## 2018-11-26 DIAGNOSIS — Z125 Encounter for screening for malignant neoplasm of prostate: Secondary | ICD-10-CM | POA: Diagnosis not present

## 2018-11-26 DIAGNOSIS — E785 Hyperlipidemia, unspecified: Secondary | ICD-10-CM

## 2018-11-26 DIAGNOSIS — M542 Cervicalgia: Secondary | ICD-10-CM

## 2018-11-26 DIAGNOSIS — Z13220 Encounter for screening for lipoid disorders: Secondary | ICD-10-CM

## 2018-11-26 DIAGNOSIS — Z6825 Body mass index (BMI) 25.0-25.9, adult: Secondary | ICD-10-CM

## 2018-11-26 DIAGNOSIS — Z0001 Encounter for general adult medical examination with abnormal findings: Secondary | ICD-10-CM

## 2018-11-26 DIAGNOSIS — K51919 Ulcerative colitis, unspecified with unspecified complications: Secondary | ICD-10-CM

## 2018-11-26 DIAGNOSIS — G8929 Other chronic pain: Secondary | ICD-10-CM

## 2018-11-26 DIAGNOSIS — K219 Gastro-esophageal reflux disease without esophagitis: Secondary | ICD-10-CM

## 2018-11-26 DIAGNOSIS — Z131 Encounter for screening for diabetes mellitus: Secondary | ICD-10-CM

## 2018-11-26 DIAGNOSIS — E663 Overweight: Secondary | ICD-10-CM

## 2018-11-26 DIAGNOSIS — H409 Unspecified glaucoma: Secondary | ICD-10-CM

## 2018-11-26 LAB — LIPID PANEL
Cholesterol: 244 mg/dL — ABNORMAL HIGH (ref 0–200)
HDL: 38.6 mg/dL — ABNORMAL LOW (ref >39.00)
NonHDL: 205.24
Total CHOL/HDL Ratio: 6
Triglycerides: 283 mg/dL — ABNORMAL HIGH (ref 0.0–149.0)
VLDL: 56.6 mg/dL — ABNORMAL HIGH (ref 0.0–40.0)

## 2018-11-26 LAB — COMPREHENSIVE METABOLIC PANEL
ALT: 16 U/L (ref 0–53)
AST: 17 U/L (ref 0–37)
Albumin: 4.8 g/dL (ref 3.5–5.2)
Alkaline Phosphatase: 56 U/L (ref 39–117)
BUN: 17 mg/dL (ref 6–23)
CO2: 30 mEq/L (ref 19–32)
Calcium: 10 mg/dL (ref 8.4–10.5)
Chloride: 101 mEq/L (ref 96–112)
Creatinine, Ser: 0.91 mg/dL (ref 0.40–1.50)
GFR: 82.85 mL/min (ref >60.00)
Glucose, Bld: 92 mg/dL (ref 70–99)
Potassium: 4.2 mEq/L (ref 3.5–5.1)
Sodium: 139 mEq/L (ref 135–145)
Total Bilirubin: 0.4 mg/dL (ref 0.2–1.2)
Total Protein: 7.2 g/dL (ref 6.0–8.3)

## 2018-11-26 LAB — CBC
HCT: 43.2 % (ref 39.0–52.0)
Hemoglobin: 14.8 g/dL (ref 13.0–17.0)
MCHC: 34.3 g/dL (ref 30.0–36.0)
MCV: 87.4 fl (ref 78.0–100.0)
Platelets: 264 10*3/uL (ref 150.0–400.0)
RBC: 4.94 Mil/uL (ref 4.22–5.81)
RDW: 13.3 % (ref 11.5–15.5)
WBC: 6.7 10*3/uL (ref 4.0–10.5)

## 2018-11-26 LAB — PSA: PSA: 1.34 ng/mL (ref 0.10–4.00)

## 2018-11-26 LAB — HEMOGLOBIN A1C: Hgb A1c MFr Bld: 5.7 % (ref 4.6–6.5)

## 2018-11-26 LAB — TSH: TSH: 4.78 u[IU]/mL — ABNORMAL HIGH (ref 0.35–4.50)

## 2018-11-26 LAB — LDL CHOLESTEROL, DIRECT: Direct LDL: 156 mg/dL

## 2018-11-26 MED ORDER — BACLOFEN 20 MG PO TABS: 10.0000 mg | ORAL_TABLET | Freq: Three times a day (TID) | ORAL | 2 refills | Status: DC

## 2018-11-26 MED ORDER — LISINOPRIL 10 MG PO TABS: 10.0000 mg | ORAL_TABLET | Freq: Every day | ORAL | 3 refills | Status: DC

## 2018-11-26 NOTE — Progress Notes (Signed)
Chief Complaint:  Benjamin Chambers. is a 68 y.o. male who presents today for his annual comprehensive physical exam.    Assessment/Plan:  Dyslipidemia Check lipid panel.  Ulcerative colitis ([place]) Continue management per GI.  GERD (gastroesophageal reflux disease) Stable.  Continue Nexium 40 mg daily.  Hypertension Slightly above goal today. Continue lisinopril 10 mg daily.  Previously has been well controlled.  Continue home blood pressure monitoring with goal 140/90 or lower.  Glaucoma Continue management per ophthalmology.   Chronic neck and back pain No red flags. Baclofen refilled.    Body mass index is 25.86 kg/m. / Overweight BMI Metric Follow Up - 11/26/18 1419      BMI Metric Follow Up-Please document annually   BMI Metric Follow Up  Education provided       Preventative Healthcare: Check CBC, C met, TSH, lipid panel, and PSA.  Flu vaccine later this year.  Patient Counseling(The following topics were reviewed and/or handout was given):  -Nutrition: Stressed importance of moderation in sodium/caffeine intake, saturated fat and cholesterol, caloric balance, sufficient intake of fresh fruits, vegetables, and fiber.  -Stressed the importance of regular exercise.   -Substance Abuse: Discussed cessation/primary prevention of tobacco, alcohol, or other drug use; driving or other dangerous activities under the influence; availability of treatment for abuse.   -Injury prevention: Discussed safety belts, safety helmets, smoke detector, smoking near bedding or upholstery.   -Sexuality: Discussed sexually transmitted diseases, partner selection, use of condoms, avoidance of unintended pregnancy and contraceptive alternatives.   -Dental health: Discussed importance of regular tooth brushing, flossing, and dental visits.  -Health maintenance and immunizations reviewed. Please refer to Health maintenance section.  Return to care in 1 year for next preventative visit.      Subjective:  HPI:  He has no acute complaints today.   His stable, chronic medical conditions are outlined below:   # Essential Hypertension - On lisinopril 69m daily and tolerating well - ROS: No reported chest pain or shortness of breath  # Chronic Neck and Back Pain - Takes baclofen as needed  # Dyslipidemia - Not on any medications  # GERD - On esomeprazole 440mdaily and tolerating well  # Ulcerative Colitis - Follows with GI - On lialda 1.2g daily and tolerating well  # Glaucoma - Follows with ophthalmology  Lifestyle Diet: Trying to eat a healthy, balanced diet with plenty of fruits and vegetables.  Exercise: No specific exercises  Depression screen PHQ 2/9 11/26/2018  Decreased Interest 1  Down, Depressed, Hopeless 1  PHQ - 2 Score 2  Altered sleeping 0  Tired, decreased energy 0  Change in appetite 0  Feeling bad or failure about yourself  0  Trouble concentrating 0  Moving slowly or fidgety/restless 0  Suicidal thoughts 0  PHQ-9 Score 2    There are no preventive care reminders to display for this patient.   ROS: Per HPI, otherwise a complete review of systems was negative.   PMH:  The following were reviewed and entered/updated in epic: Past Medical History:  Diagnosis Date  . Allergy   . GERD (gastroesophageal reflux disease)   . Hypertension   . Osteoarthritis   . [facility]   Patient Active Problem List   Diagnosis Date Noted  . Glaucoma 11/26/2018  . Chronic neck and back pain 11/26/2018  . History of skin cancer 03/08/2017  . Dyslipidemia 02/12/2017  . Hypertension 02/09/2017  . GERD (gastroesophageal reflux disease)   .  Allergy   . Ulcerative colitis [facility])    Past Surgical History:  Procedure Laterality Date  . BASAL CELL CARCINOMA EXCISION  2011  . HERNIA REPAIR  2018  . SQUAMOUS CELL CARCINOMA EXCISION  2015    History reviewed. No pertinent family history.  Medications- reviewed and updated Current  Outpatient Medications  Medication Sig Dispense Refill  . baclofen (LIORESAL) 20 MG tablet Take 0.5-1 tablets (10-20 mg total) by mouth 3 (three) times daily. 90 each 2  . cetirizine (ZYRTEC) 10 MG tablet Take by mouth.    . esomeprazole (NEXIUM) 40 MG capsule Take 40 mg daily at 12 noon by mouth.    . Garlic 4196 MG CAPS Take by mouth.    . Ginseng 100 MG CAPS Take by mouth.    [name] glucosamine-chondroitin 500-400 MG tablet Take 1 tablet by mouth 3 (three) times daily.    [name] KRILL OIL PO Take 520 mg by mouth.     . latanoprost (XALATAN) 0.005 % ophthalmic solution 1 drop at bedtime.    [name] lisinopril (ZESTRIL) 10 MG tablet Take 1 tablet (10 mg total) by mouth daily. 90 tablet 3  . mesalamine (LIALDA) 1.2 g EC tablet Take by mouth.    . Multiple Vitamins-Minerals (PRESERVISION AREDS PO) Take one tablet daily.    . niacin 500 MG tablet Take by mouth.    . Plant Sterols and Stanols 450 MG TABS Take by mouth.    . Red Yeast Rice 600 MG CAPS Take by mouth.    . timolol (TIMOPTIC-XR) 0.5 % ophthalmic gel-forming 1 drop daily.    . Turmeric (QC TUMERIC COMPLEX PO) Take by mouth.    . Zinc Methionate 50 MG CAPS Take by mouth.     No current facility-administered medications for this visit.     Allergies-reviewed and updated Allergies  Allergen Reactions  . Penicillins     Family history only  . Strawberry Extract Hives and Itching  . Codeine Nausea Only    Social History   Socioeconomic History  . Marital status: Married    Spouse name: Not on file  . Number of children: Not on file  . Years of education: Not on file  . Highest education level: Not on file  Occupational History  . Not on file  Social Needs  . Financial resource strain: Not on file  . Food insecurity    Worry: Not on file    Inability: Not on file  . Transportation needs    Medical: Not on file    Non-medical: Not on file  Tobacco Use  . Smoking status: Former Research scientist (life sciences)  . Smokeless tobacco: Never Used  Substance  and Sexual Activity  . Alcohol use: Yes    Comment: Occasional  . Drug use: No  . Sexual activity: Not on file  Lifestyle  . Physical activity    Days per week: Not on file    Minutes per session: Not on file  . Stress: Not on file  Relationships  . Social Herbalist on phone: Not on file    Gets together: Not on file    Attends religious service: Not on file    Active member of club or organization: Not on file    Attends meetings of clubs or organizations: Not on file    Relationship status: Not on file  Other Topics Concern  . Not on file  Social History Narrative  . Not on file  Objective:  Physical Exam: BP (!) 153/103   Pulse 69   Temp 98.3 F (36.8 C)   Ht 5' 11.5" (1.816 m)   Wt 188 lb (85.3 kg)   SpO2 99%   BMI 25.86 kg/m   Body mass index is 25.86 kg/m. Wt Readings from Last 3 Encounters:  11/26/18 188 lb (85.3 kg)  06/22/17 188 lb (85.3 kg)  02/09/17 186 lb 3.2 oz (84.5 kg)   Gen: NAD, resting comfortably HEENT: TMs normal bilaterally. OP clear. No thyromegaly noted.  CV: RRR with no murmurs appreciated Pulm: NWOB, CTAB with no crackles, wheezes, or rhonchi GI: Normal bowel sounds present. Soft, Nontender, Nondistended. MSK: no edema, cyanosis, or clubbing noted Skin: warm, dry Neuro: CN2-12 grossly intact. Strength 5/5 in upper and lower extremities. Reflexes symmetric and intact bilaterally.  Psych: Normal affect and thought content      M. Jerline Pain, MD 11/26/2018 2:23 PM

## 2018-11-26 NOTE — Assessment & Plan Note (Signed)
Continue management per GI.  

## 2018-11-26 NOTE — Assessment & Plan Note (Signed)
Stable. Continue Nexium 40mg daily

## 2018-11-26 NOTE — Assessment & Plan Note (Signed)
Check lipid panel  

## 2018-11-26 NOTE — Assessment & Plan Note (Addendum)
Slightly above goal today. Continue lisinopril 10 mg daily.  Previously has been well controlled.  Continue home blood pressure monitoring with goal 140/90 or lower.

## 2018-11-26 NOTE — Assessment & Plan Note (Signed)
Continue management per ophthalmology. 

## 2018-11-26 NOTE — Assessment & Plan Note (Addendum)
No red flags. Baclofen refilled.

## 2018-11-26 NOTE — Patient Instructions (Signed)
It was very nice to see you today!  I will refill your baclofen today.  We will check blood work today.  Keep an eye on your blood pressure and let me know if persistently 140/90  Take care, Dr Jerline Pain  Please try these tips to maintain a healthy lifestyle:   Eat at least 3 REAL meals and 1-2 snacks per day.  Aim for no more than 5 hours between eating.  If you eat breakfast, please do so within one hour of getting up.    Obtain twice as many fruits/vegetables as protein or carbohydrate foods for both lunch and dinner. (Half of each meal should be fruits/vegetables, one quarter protein, and one quarter starchy carbs)   Cut down on sweet beverages. This includes juice, soda, and sweet tea.    Exercise at least 150 minutes every week.    Preventive Care 68 Years and Older, Male Preventive care refers to lifestyle choices and visits with your health care provider that can promote health and wellness. This includes:  A yearly physical exam. This is also called an annual well check.  Regular dental and eye exams.  Immunizations.  Screening for certain conditions.  Healthy lifestyle choices, such as diet and exercise. What can I expect for my preventive care visit? Physical exam Your health care provider will check:  Height and weight. These may be used to calculate body mass index (BMI), which is a measurement that tells if you are at a healthy weight.  Heart rate and blood pressure.  Your skin for abnormal spots. Counseling Your health care provider may ask you questions about:  Alcohol, tobacco, and drug use.  Emotional well-being.  Home and relationship well-being.  Sexual activity.  Eating habits.  History of falls.  Memory and ability to understand (cognition).  Work and work Statistician. What immunizations do I need?  Influenza (flu) vaccine  This is recommended every year. Tetanus, diphtheria, and pertussis (Tdap) vaccine  You may need a Td  booster every 10 years. Varicella (chickenpox) vaccine  You may need this vaccine if you have not already been vaccinated. Zoster (shingles) vaccine  You may need this after age 66. Pneumococcal conjugate (PCV13) vaccine  One dose is recommended after age 15. Pneumococcal polysaccharide (PPSV23) vaccine  One dose is recommended after age 64. Measles, mumps, and rubella (MMR) vaccine  You may need at least one dose of MMR if you were born in 1957 or later. You may also need a second dose. Meningococcal conjugate (MenACWY) vaccine  You may need this if you have certain conditions. Hepatitis A vaccine  You may need this if you have certain conditions or if you travel or work in places where you may be exposed to hepatitis A. Hepatitis B vaccine  You may need this if you have certain conditions or if you travel or work in places where you may be exposed to hepatitis B. Haemophilus influenzae type b (Hib) vaccine  You may need this if you have certain conditions. You may receive vaccines as individual doses or as more than one vaccine together in one shot (combination vaccines). Talk with your health care provider about the risks and benefits of combination vaccines. What tests do I need? Blood tests  Lipid and cholesterol levels. These may be checked every 5 years, or more frequently depending on your overall health.  Hepatitis C test.  Hepatitis B test. Screening  Lung cancer screening. You may have this screening every year starting at age 33  if you have a 30-pack-year history of smoking and currently smoke or have quit within the past 15 years.  Colorectal cancer screening. All adults should have this screening starting at age 67 and continuing until age 47. Your health care provider may recommend screening at age 73 if you are at increased risk. You will have tests every 1-10 years, depending on your results and the type of screening test.  Prostate cancer screening.  Recommendations will vary depending on your family history and other risks.  Diabetes screening. This is done by checking your blood sugar (glucose) after you have not eaten for a while (fasting). You may have this done every 1-3 years.  Abdominal aortic aneurysm (AAA) screening. You may need this if you are a current or former smoker.  Sexually transmitted disease (STD) testing. Follow these instructions at home: Eating and drinking  Eat a diet that includes fresh fruits and vegetables, whole grains, lean protein, and low-fat dairy products. Limit your intake of foods with high amounts of sugar, saturated fats, and salt.  Take vitamin and mineral supplements as recommended by your health care provider.  Do not drink alcohol if your health care provider tells you not to drink.  If you drink alcohol: ? Limit how much you have to 0-2 drinks a day. ? Be aware of how much alcohol is in your drink. In the [place] one drink equals one 12 oz bottle of beer (355 mL), one 5 oz glass of wine (148 mL), or one 1 oz glass of hard liquor (44 mL). Lifestyle  Take daily care of your teeth and gums.  Stay active. Exercise for at least 30 minutes on 5 or more days each week.  Do not use any products that contain nicotine or tobacco, such as cigarettes, e-cigarettes, and chewing tobacco. If you need help quitting, ask your health care provider.  If you are sexually active, practice safe sex. Use a condom or other form of protection to prevent STIs (sexually transmitted infections).  Talk with your health care provider about taking a low-dose aspirin or statin. What's next?  Visit your health care provider once a year for a well check visit.  Ask your health care provider how often you should have your eyes and teeth checked.  Stay up to date on all vaccines. This information is not intended to replace advice given to you by your health care provider. Make sure you discuss any questions you have with  your health care provider. Document Released: 04/16/2015 Document Revised: 03/14/2018 Document Reviewed: 03/14/2018 Elsevier Patient Education  2020 [employer].

## 2018-11-27 ENCOUNTER — Other Ambulatory Visit: Payer: Self-pay

## 2018-11-27 ENCOUNTER — Encounter: Payer: Self-pay | Admitting: Family Medicine

## 2018-11-27 DIAGNOSIS — R739 Hyperglycemia, unspecified: Secondary | ICD-10-CM | POA: Insufficient documentation

## 2018-11-27 MED ORDER — SIMVASTATIN 20 MG PO TABS: 20.0000 mg | ORAL_TABLET | Freq: Every day | ORAL | 4 refills | Status: DC

## 2018-11-27 NOTE — Progress Notes (Signed)
Please inform patient of the following:  Cholesterol is up a bit since last year - recommend starting lipitor 40mg  daily to improve numbers and reduce risk of heart attack and stroke. Please send in if pt is willing to start. His blood sugar was also borderline but he does not need to start any medications for this . Regardless he should continue to work on diet and exercise and we can recheck next year.  Everything else was NORMAL.   Benjamin Chambers. Jerline Pain, MD 11/27/2018 8:09 AM

## 2018-12-26 DIAGNOSIS — H401131 Primary open-angle glaucoma, bilateral, mild stage: Secondary | ICD-10-CM | POA: Diagnosis not present

## 2019-01-03 ENCOUNTER — Ambulatory Visit (INDEPENDENT_AMBULATORY_CARE_PROVIDER_SITE_OTHER): Payer: BC Managed Care – PPO

## 2019-01-03 ENCOUNTER — Other Ambulatory Visit: Payer: Self-pay

## 2019-01-03 DIAGNOSIS — Z23 Encounter for immunization: Secondary | ICD-10-CM

## 2019-01-03 NOTE — Addendum Note (Signed)
Addended by: Elmer Bales on: 01/03/2019 02:13 PM   Modules accepted: Orders

## 2019-02-05 DIAGNOSIS — D485 Neoplasm of uncertain behavior of skin: Secondary | ICD-10-CM | POA: Diagnosis not present

## 2019-02-05 DIAGNOSIS — D1801 Hemangioma of skin and subcutaneous tissue: Secondary | ICD-10-CM | POA: Diagnosis not present

## 2019-02-05 DIAGNOSIS — L821 Other seborrheic keratosis: Secondary | ICD-10-CM | POA: Diagnosis not present

## 2019-03-24 ENCOUNTER — Other Ambulatory Visit: Payer: Self-pay | Admitting: Family Medicine

## 2019-03-25 ENCOUNTER — Other Ambulatory Visit: Payer: Self-pay | Admitting: Family Medicine

## 2019-04-07 DIAGNOSIS — H18413 Arcus senilis, bilateral: Secondary | ICD-10-CM | POA: Diagnosis not present

## 2019-04-07 DIAGNOSIS — H2513 Age-related nuclear cataract, bilateral: Secondary | ICD-10-CM | POA: Diagnosis not present

## 2019-04-07 DIAGNOSIS — H401131 Primary open-angle glaucoma, bilateral, mild stage: Secondary | ICD-10-CM | POA: Diagnosis not present

## 2019-05-18 ENCOUNTER — Ambulatory Visit: Payer: Medicare Other | Attending: Internal Medicine

## 2019-05-18 DIAGNOSIS — Z23 Encounter for immunization: Secondary | ICD-10-CM

## 2019-05-18 NOTE — Progress Notes (Signed)
   [id number]  Name:  Benjamin Chambers.    MRN: [id number] DOB: 07-06-1950  05/18/2019  [name] was observed post Covid-19 immunization for 15 minutes without incidence. He was provided with Vaccine Information Sheet and instruction to access the V-Safe system.   [name] was instructed to call 911 with any severe reactions post vaccine: [name] Difficulty breathing  . Swelling of your face and throat  . A fast heartbeat  . A bad rash all over your body  . Dizziness and weakness    Immunizations Administered    Name Date Dose VIS Date Route   Pfizer COVID-19 Vaccine 05/18/2019 11:24 AM 0.3 mL 03/14/2019 Intramuscular   Manufacturer: [place]   Lot: [id number]   [place]: [id number]

## 2019-06-10 ENCOUNTER — Ambulatory Visit: Payer: Medicare Other | Attending: Internal Medicine

## 2019-06-10 DIAGNOSIS — Z23 Encounter for immunization: Secondary | ICD-10-CM | POA: Insufficient documentation

## 2019-06-10 NOTE — Progress Notes (Signed)
   [id number]  Name:  Benjamin Chambers.    MRN: [id number] DOB: 10-12-1950  06/10/2019  Benjamin Chambers was observed post Covid-19 immunization for 15 minutes without incident. He was provided with Vaccine Information Sheet and instruction to access the V-Safe system.   Benjamin Chambers was instructed to call 911 with any severe reactions post vaccine: [name] Difficulty breathing  . Swelling of face and throat  . A fast heartbeat  . A bad rash all over body  . Dizziness and weakness   Immunizations Administered    Name Date Dose VIS Date Route   Pfizer COVID-19 Vaccine 06/10/2019  2:29 PM 0.3 mL 03/14/2019 Intramuscular   Manufacturer: [place]   Lot: [id number]   [place]: [id number]

## 2019-06-17 DIAGNOSIS — Z5181 Encounter for therapeutic drug level monitoring: Secondary | ICD-10-CM | POA: Diagnosis not present

## 2019-06-17 DIAGNOSIS — K222 Esophageal obstruction: Secondary | ICD-10-CM | POA: Diagnosis not present

## 2019-06-17 DIAGNOSIS — K219 Gastro-esophageal reflux disease without esophagitis: Secondary | ICD-10-CM | POA: Diagnosis not present

## 2019-06-17 DIAGNOSIS — K5130 Ulcerative (chronic) rectosigmoiditis without complications: Secondary | ICD-10-CM | POA: Diagnosis not present

## 2019-06-22 ENCOUNTER — Other Ambulatory Visit: Payer: Self-pay | Admitting: Family Medicine

## 2019-06-23 NOTE — Telephone Encounter (Signed)
LAST APPOINTMENT DATE:11/26/2018 NEXT APPOINTMENT DATE:@8 /31/2021  Rx Baclofen 20mg  LAST REFILL: 03/24/2019  QTY:#[id number]

## 2019-08-07 DIAGNOSIS — H401131 Primary open-angle glaucoma, bilateral, mild stage: Secondary | ICD-10-CM | POA: Diagnosis not present

## 2019-08-07 DIAGNOSIS — H25813 Combined forms of age-related cataract, bilateral: Secondary | ICD-10-CM | POA: Diagnosis not present

## 2019-08-07 DIAGNOSIS — H31011 Macula scars of posterior pole (postinflammatory) (post-traumatic), right eye: Secondary | ICD-10-CM | POA: Diagnosis not present

## 2019-09-03 ENCOUNTER — Encounter: Payer: Self-pay | Admitting: Family Medicine

## 2019-09-09 ENCOUNTER — Ambulatory Visit (INDEPENDENT_AMBULATORY_CARE_PROVIDER_SITE_OTHER): Payer: BC Managed Care – PPO

## 2019-09-09 ENCOUNTER — Other Ambulatory Visit: Payer: Self-pay

## 2019-09-09 DIAGNOSIS — Z23 Encounter for immunization: Secondary | ICD-10-CM | POA: Diagnosis not present

## 2019-11-20 ENCOUNTER — Other Ambulatory Visit: Payer: Self-pay | Admitting: Family Medicine

## 2019-12-02 ENCOUNTER — Encounter: Payer: Self-pay | Admitting: Family Medicine

## 2019-12-02 ENCOUNTER — Other Ambulatory Visit: Payer: Self-pay

## 2019-12-02 ENCOUNTER — Ambulatory Visit (INDEPENDENT_AMBULATORY_CARE_PROVIDER_SITE_OTHER): Payer: BC Managed Care – PPO | Admitting: Family Medicine

## 2019-12-02 VITALS — BP 151/96 | HR 74 | Temp 98.6°F | Ht 71.5 in | Wt 189.4 lb

## 2019-12-02 DIAGNOSIS — Z6826 Body mass index (BMI) 26.0-26.9, adult: Secondary | ICD-10-CM

## 2019-12-02 DIAGNOSIS — R739 Hyperglycemia, unspecified: Secondary | ICD-10-CM | POA: Diagnosis not present

## 2019-12-02 DIAGNOSIS — K51919 Ulcerative colitis, unspecified with unspecified complications: Secondary | ICD-10-CM | POA: Diagnosis not present

## 2019-12-02 DIAGNOSIS — Z125 Encounter for screening for malignant neoplasm of prostate: Secondary | ICD-10-CM

## 2019-12-02 DIAGNOSIS — E785 Hyperlipidemia, unspecified: Secondary | ICD-10-CM | POA: Diagnosis not present

## 2019-12-02 DIAGNOSIS — Z0001 Encounter for general adult medical examination with abnormal findings: Secondary | ICD-10-CM | POA: Diagnosis not present

## 2019-12-02 DIAGNOSIS — K219 Gastro-esophageal reflux disease without esophagitis: Secondary | ICD-10-CM

## 2019-12-02 DIAGNOSIS — E663 Overweight: Secondary | ICD-10-CM

## 2019-12-02 DIAGNOSIS — I10 Essential (primary) hypertension: Secondary | ICD-10-CM

## 2019-12-02 DIAGNOSIS — R351 Nocturia: Secondary | ICD-10-CM

## 2019-12-02 MED ORDER — TAMSULOSIN HCL 0.4 MG PO CAPS: 0.4000 mg | ORAL_CAPSULE | Freq: Every day | ORAL | 3 refills | Status: DC

## 2019-12-02 NOTE — Assessment & Plan Note (Signed)
Check A1c. 

## 2019-12-02 NOTE — Assessment & Plan Note (Signed)
Slightly above goal though is typically well controlled at home.  Continue lisinopril 10 mg daily.  Check CBC, CMET, TSH.

## 2019-12-02 NOTE — Patient Instructions (Signed)
It was very nice to see you today!  We will check blood work and urine sample today.  You probably have an enlarged prostate.  Please start Flomax.  Let me know if you have any side effects.  Please send me a message in a couple weeks to let me know how it is working for you.  I will see you back in a year.  Please collect any sooner if  Take care, Dr Jerline Pain  Please try these tips to maintain a healthy lifestyle:   Eat at least 3 REAL meals and 1-2 snacks per day.  Aim for no more than 5 hours between eating.  If you eat breakfast, please do so within one hour of getting up.    Each meal should contain half fruits/vegetables, one quarter protein, and one quarter carbs (no bigger than a computer mouse)   Cut down on sweet beverages. This includes juice, soda, and sweet tea.     Drink at least 1 glass of water with each meal and aim for at least 8 glasses per day   Exercise at least 150 minutes every week.    Preventive Care 19 Years and Older, Male Preventive care refers to lifestyle choices and visits with your health care provider that can promote health and wellness. This includes:  A yearly physical exam. This is also called an annual well check.  Regular dental and eye exams.  Immunizations.  Screening for certain conditions.  Healthy lifestyle choices, such as diet and exercise. What can I expect for my preventive care visit? Physical exam Your health care provider will check:  Height and weight. These may be used to calculate body mass index (BMI), which is a measurement that tells if you are at a healthy weight.  Heart rate and blood pressure.  Your skin for abnormal spots. Counseling Your health care provider may ask you questions about:  Alcohol, tobacco, and drug use.  Emotional well-being.  Home and relationship well-being.  Sexual activity.  Eating habits.  History of falls.  Memory and ability to understand (cognition).  Work and work  Statistician. What immunizations do I need?  Influenza (flu) vaccine  This is recommended every year. Tetanus, diphtheria, and pertussis (Tdap) vaccine  You may need a Td booster every 10 years. Varicella (chickenpox) vaccine  You may need this vaccine if you have not already been vaccinated. Zoster (shingles) vaccine  You may need this after age 19. Pneumococcal conjugate (PCV13) vaccine  One dose is recommended after age 78. Pneumococcal polysaccharide (PPSV23) vaccine  One dose is recommended after age 16. Measles, mumps, and rubella (MMR) vaccine  You may need at least one dose of MMR if you were born in 1957 or later. You may also need a second dose. Meningococcal conjugate (MenACWY) vaccine  You may need this if you have certain conditions. Hepatitis A vaccine  You may need this if you have certain conditions or if you travel or work in places where you may be exposed to hepatitis A. Hepatitis B vaccine  You may need this if you have certain conditions or if you travel or work in places where you may be exposed to hepatitis B. Haemophilus influenzae type b (Hib) vaccine  You may need this if you have certain conditions. You may receive vaccines as individual doses or as more than one vaccine together in one shot (combination vaccines). Talk with your health care provider about the risks and benefits of combination vaccines. What tests do  I need? Blood tests  Lipid and cholesterol levels. These may be checked every 5 years, or more frequently depending on your overall health.  Hepatitis C test.  Hepatitis B test. Screening  Lung cancer screening. You may have this screening every year starting at age 54 if you have a 30-pack-year history of smoking and currently smoke or have quit within the past 15 years.  Colorectal cancer screening. All adults should have this screening starting at age 26 and continuing until age 23. Your health care provider may recommend  screening at age 70 if you are at increased risk. You will have tests every 1-10 years, depending on your results and the type of screening test.  Prostate cancer screening. Recommendations will vary depending on your family history and other risks.  Diabetes screening. This is done by checking your blood sugar (glucose) after you have not eaten for a while (fasting). You may have this done every 1-3 years.  Abdominal aortic aneurysm (AAA) screening. You may need this if you are a current or former smoker.  Sexually transmitted disease (STD) testing. Follow these instructions at home: Eating and drinking  Eat a diet that includes fresh fruits and vegetables, whole grains, lean protein, and low-fat dairy products. Limit your intake of foods with high amounts of sugar, saturated fats, and salt.  Take vitamin and mineral supplements as recommended by your health care provider.  Do not drink alcohol if your health care provider tells you not to drink.  If you drink alcohol: ? Limit how much you have to 0-2 drinks a day. ? Be aware of how much alcohol is in your drink. In the [place] one drink equals one 12 oz bottle of beer (355 mL), one 5 oz glass of wine (148 mL), or one 1 oz glass of hard liquor (44 mL). Lifestyle  Take daily care of your teeth and gums.  Stay active. Exercise for at least 30 minutes on 5 or more days each week.  Do not use any products that contain nicotine or tobacco, such as cigarettes, e-cigarettes, and chewing tobacco. If you need help quitting, ask your health care provider.  If you are sexually active, practice safe sex. Use a condom or other form of protection to prevent STIs (sexually transmitted infections).  Talk with your health care provider about taking a low-dose aspirin or statin. What's next?  Visit your health care provider once a year for a well check visit.  Ask your health care provider how often you should have your eyes and teeth  checked.  Stay up to date on all vaccines. This information is not intended to replace advice given to you by your health care provider. Make sure you discuss any questions you have with your health care provider. Document Revised: 03/14/2018 Document Reviewed: 03/14/2018 Elsevier Patient Education  2020 [employer].

## 2019-12-02 NOTE — Assessment & Plan Note (Signed)
Stable. Continue management per GI.  

## 2019-12-02 NOTE — Assessment & Plan Note (Signed)
Check lipid panel.  Has not been taking statin.  He has been taking over-the-counter supplements instead.  May need to restart depending on results.

## 2019-12-02 NOTE — Progress Notes (Signed)
Chief Complaint:  Benjamin Chambers. is a 69 y.o. male who presents today for his annual comprehensive physical exam.    Assessment/Plan:  New/Acute Problems: Nocturia Likely BPH.  Will start Flomax.  Discussed potential side effects.  Will check UA and urine culture to rule out other possible etiologies.  Chronic Problems Addressed Today: Hyperglycemia Check A1c.  Dyslipidemia Check lipid panel.  Has not been taking statin.  He has been taking over-the-counter supplements instead.  May need to restart depending on results.  Ulcerative colitis ([place]) Continue management per GI.  GERD (gastroesophageal reflux disease) Stable.  Continue management per GI.  Hypertension Slightly above goal though is typically well controlled at home.  Continue lisinopril 10 mg daily.  Check CBC, CMET, TSH.   Body mass index is 26.05 kg/m. / Overweight  BMI Metric Follow Up - 12/02/19 1317      BMI Metric Follow Up-Please document annually   BMI Metric Follow Up Education provided            Preventative Healthcare: Check CBC, CMET, TSH, lipid panel.  Check A1c.  Check PSA.  Up-to-date on other preventive healthcare measures.  Patient Counseling(The following topics were reviewed and/or handout was given):  -Nutrition: Stressed importance of moderation in sodium/caffeine intake, saturated fat and cholesterol, caloric balance, sufficient intake of fresh fruits, vegetables, and fiber.  -Stressed the importance of regular exercise.   -Substance Abuse: Discussed cessation/primary prevention of tobacco, alcohol, or other drug use; driving or other dangerous activities under the influence; availability of treatment for abuse.   -Injury prevention: Discussed safety belts, safety helmets, smoke detector, smoking near bedding or upholstery.   -Sexuality: Discussed sexually transmitted diseases, partner selection, use of condoms, avoidance of unintended pregnancy and contraceptive alternatives.    -Dental health: Discussed importance of regular tooth brushing, flossing, and dental visits.  -Health maintenance and immunizations reviewed. Please refer to Health maintenance section.  Return to care in 1 year for next preventative visit.     Subjective:  HPI:  He has no acute complaints today.   More nocturia over the last few months. Worse some nights than others.   Lifestyle Diet: Balanced. Plenty of fruits and vegetables.  Exercise: Likes to walk.   Depression screen [facility] 2/9 12/02/2019  Decreased Interest 0  Down, Depressed, Hopeless 0  PHQ - 2 Score 0  Altered sleeping -  Tired, decreased energy -  Change in appetite -  Feeling bad or failure about yourself  -  Trouble concentrating -  Moving slowly or fidgety/restless -  Suicidal thoughts -  PHQ-9 Score -   There are no preventive care reminders to display for this patient.  ROS: Per HPI, otherwise a complete review of systems was negative.   PMH:  The following were reviewed and entered/updated in epic: Past Medical History:  Diagnosis Date  . Allergy   . GERD (gastroesophageal reflux disease)   . Hypertension   . Osteoarthritis   . Ulcerative colitis [facility])    Patient Active Problem List   Diagnosis Date Noted  . Nocturia 12/02/2019  . Hyperglycemia 11/27/2018  . Glaucoma 11/26/2018  . 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 History of skin cancer 03/08/2017  . Dyslipidemia 02/12/2017  . Hypertension 02/09/2017  . GERD (gastroesophageal reflux disease)   . Allergy   . Ulcerative colitis [facility])    Past Surgical History:  Procedure Laterality Date  . BASAL CELL CARCINOMA EXCISION  2011  . HERNIA REPAIR  2018  . SQUAMOUS CELL CARCINOMA EXCISION  2015    History reviewed. No pertinent family history.  Medications- reviewed and updated Current Outpatient Medications  Medication Sig Dispense Refill  . esomeprazole (NEXIUM) 40 MG capsule Take 40 mg daily at 12 noon by mouth.    . Garlic 5102 MG  CAPS Take by mouth.    [name] glucosamine-chondroitin 500-400 MG tablet Take 1 tablet by mouth 3 (three) times daily.    [name] KRILL OIL PO Take 520 mg by mouth.     . latanoprost (XALATAN) 0.005 % ophthalmic solution 1 drop at bedtime.    [name] lisinopril (ZESTRIL) 10 MG tablet TAKE 1 TABLET BY MOUTH EVERY DAY 90 tablet 3  . mesalamine (LIALDA) 1.2 g EC tablet Take by mouth.    . Multiple Vitamins-Minerals (PRESERVISION AREDS PO) Take one tablet daily.    . niacin 500 MG tablet Take by mouth.    . Plant Sterols and Stanols 450 MG TABS Take by mouth.    . Red Yeast Rice 600 MG CAPS Take by mouth.    . timolol (TIMOPTIC-XR) 0.5 % ophthalmic gel-forming 1 drop daily.    . Turmeric (QC TUMERIC COMPLEX PO) Take by mouth.    . baclofen (LIORESAL) 20 MG tablet TAKE 0.5-1 TABLETS (10-20 MG TOTAL) BY MOUTH 3 (THREE) TIMES DAILY. (Patient not taking: Reported on 12/02/2019) 270 tablet 0  . cetirizine (ZYRTEC) 10 MG tablet Take by mouth. (Patient not taking: Reported on 12/02/2019)    . Ginseng 100 MG CAPS Take by mouth. (Patient not taking: Reported on 12/02/2019)    . tamsulosin (FLOMAX) 0.4 MG CAPS capsule Take 1 capsule (0.4 mg total) by mouth daily. 30 capsule 3   No current facility-administered medications for this visit.    Allergies-reviewed and updated Allergies  Allergen Reactions  . Penicillins     Family history only  . Strawberry Extract Hives and Itching  . Codeine Nausea Only    Social History   Socioeconomic History  . Marital status: Married    Spouse name: Not on file  . Number of children: Not on file  . Years of education: Not on file  . Highest education level: Not on file  Occupational History  . Not on file  Tobacco Use  . Smoking status: Former Research scientist (life sciences)  . Smokeless tobacco: Never Used  Substance and Sexual Activity  . Alcohol use: Yes    Comment: Occasional  . Drug use: No  . Sexual activity: Not on file  Other Topics Concern  . Not on file  Social History Narrative  .  Not on file   Social Determinants of Health   Financial Resource Strain:   . Difficulty of Paying Living Expenses: Not on file  Food Insecurity:   . Worried About Charity fundraiser in the Last Year: Not on file  . Ran Out of Food in the Last Year: Not on file  Transportation Needs:   . Lack of Transportation (Medical): Not on file  . Lack of Transportation (Non-Medical): Not on file  Physical Activity:   . Days of Exercise per Week: Not on file  . Minutes of Exercise per Session: Not on file  Stress:   . Feeling of Stress : Not on file  Social Connections:   . Frequency of Communication with Friends and Family: Not on file  . Frequency of Social Gatherings with Friends and Family: Not on file  . Attends Religious Services: Not on file  .  Active Member of Clubs or Organizations: Not on file  . Attends Archivist Meetings: Not on file  . Marital Status: Not on file        Objective:  Physical Exam: BP (!) 151/96   Pulse 74   Temp 98.6 F (37 C) (Temporal)   Ht 5' 11.5" (1.816 m)   Wt 189 lb 6.4 oz (85.9 kg)   SpO2 99%   BMI 26.05 kg/m   Body mass index is 26.05 kg/m. Wt Readings from Last 3 Encounters:  12/02/19 189 lb 6.4 oz (85.9 kg)  11/26/18 188 lb (85.3 kg)  06/22/17 188 lb (85.3 kg)  Gen: NAD, resting comfortably HEENT: TMs normal bilaterally. OP clear. No thyromegaly noted.  CV: RRR with no murmurs appreciated Pulm: NWOB, CTAB with no crackles, wheezes, or rhonchi GI: Normal bowel sounds present. Soft, Nontender, Nondistended. MSK: no edema, cyanosis, or clubbing noted Skin: warm, dry Neuro: CN2-12 grossly intact. Strength 5/5 in upper and lower extremities. Reflexes symmetric and intact bilaterally.  Psych: Normal affect and thought content     Benjamin Lagrand M. Jerline Pain, MD 12/02/2019 2:30 PM

## 2019-12-02 NOTE — Assessment & Plan Note (Signed)
Continue management per GI.  

## 2019-12-03 LAB — URINE CULTURE
MICRO NUMBER:: 10893915
Result:: NO GROWTH
SPECIMEN QUALITY:: ADEQUATE

## 2019-12-03 LAB — COMPREHENSIVE METABOLIC PANEL
AG Ratio: 1.8 (calc) (ref 1.0–2.5)
ALT: 11 U/L (ref 9–46)
AST: 16 U/L (ref 10–35)
Albumin: 4.6 g/dL (ref 3.6–5.1)
Alkaline phosphatase (APISO): 58 U/L (ref 35–144)
BUN/Creatinine Ratio: 15 (calc) (ref 6–22)
BUN: 21 mg/dL (ref 7–25)
CO2: 26 mmol/L (ref 20–32)
Calcium: 9.8 mg/dL (ref 8.6–10.3)
Chloride: 103 mmol/L (ref 98–110)
Creat: 1.38 mg/dL — ABNORMAL HIGH (ref 0.70–1.25)
Globulin: 2.6 g/dL (calc) (ref 1.9–3.7)
Glucose, Bld: 93 mg/dL (ref 65–99)
Potassium: 4.4 mmol/L (ref 3.5–5.3)
Sodium: 139 mmol/L (ref 135–146)
Total Bilirubin: 0.5 mg/dL (ref 0.2–1.2)
Total Protein: 7.2 g/dL (ref 6.1–8.1)

## 2019-12-03 LAB — CBC
HCT: 41.7 % (ref 38.5–50.0)
Hemoglobin: 14.2 g/dL (ref 13.2–17.1)
MCH: 29.6 pg (ref 27.0–33.0)
MCHC: 34.1 g/dL (ref 32.0–36.0)
MCV: 87.1 fL (ref 80.0–100.0)
MPV: 9.9 fL (ref 7.5–12.5)
Platelets: 303 10*3/uL (ref 140–400)
RBC: 4.79 10*6/uL (ref 4.20–5.80)
RDW: 12.9 % (ref 11.0–15.0)
WBC: 8.3 10*3/uL (ref 3.8–10.8)

## 2019-12-03 LAB — HEMOGLOBIN A1C
Hgb A1c MFr Bld: 5.5 % of total Hgb (ref <5.7)
Mean Plasma Glucose: 111 (calc)
eAG (mmol/L): 6.2 (calc)

## 2019-12-03 LAB — PSA: PSA: 1.4 ng/mL (ref <=4.0)

## 2019-12-03 LAB — URINALYSIS, ROUTINE W REFLEX MICROSCOPIC
Bacteria, UA: NONE SEEN /HPF
Bilirubin Urine: NEGATIVE
Glucose, UA: NEGATIVE
Hgb urine dipstick: NEGATIVE
Hyaline Cast: NONE SEEN /LPF
Ketones, ur: NEGATIVE
Nitrite: NEGATIVE
Protein, ur: NEGATIVE
RBC / HPF: NONE SEEN /HPF (ref 0–2)
Specific Gravity, Urine: 1.005 (ref 1.001–1.03)
Squamous Epithelial / HPF: NONE SEEN /HPF (ref <=5)
pH: 6.5 (ref 5.0–8.0)

## 2019-12-03 LAB — LIPID PANEL
Cholesterol: 226 mg/dL — ABNORMAL HIGH (ref <200)
HDL: 44 mg/dL (ref >=40)
LDL Cholesterol (Calc): 143 mg/dL (calc) — ABNORMAL HIGH
Non-HDL Cholesterol (Calc): 182 mg/dL (calc) — ABNORMAL HIGH (ref <130)
Total CHOL/HDL Ratio: 5.1 (calc) — ABNORMAL HIGH (ref <5.0)
Triglycerides: 254 mg/dL — ABNORMAL HIGH (ref <150)

## 2019-12-03 LAB — TSH: TSH: 6.5 mIU/L — ABNORMAL HIGH (ref 0.40–4.50)

## 2019-12-05 NOTE — Progress Notes (Signed)
Please inform patient of the following:  Cholesterol numbers are better - ok for him to stayy off cholesterol medications.  Thyroid level is off - would like for him to come back to recheck. Please place order for TSH, free t4 and free t3.   His kidney numbers are up - probably dehydration but I would like for him to come back and check this as well. Please place order for BMET.   Everything else is NORMAL.

## 2019-12-09 ENCOUNTER — Other Ambulatory Visit: Payer: Self-pay

## 2019-12-09 ENCOUNTER — Other Ambulatory Visit: Payer: Self-pay | Admitting: *Deleted

## 2019-12-09 ENCOUNTER — Other Ambulatory Visit: Payer: BC Managed Care – PPO

## 2019-12-09 DIAGNOSIS — Z0001 Encounter for general adult medical examination with abnormal findings: Secondary | ICD-10-CM

## 2019-12-09 NOTE — Addendum Note (Signed)
Addended by: Liliane Channel on: 12/09/2019 10:29 AM   Modules accepted: Orders

## 2019-12-10 LAB — BASIC METABOLIC PANEL
BUN/Creatinine Ratio: 12 (calc) (ref 6–22)
BUN: 18 mg/dL (ref 7–25)
CO2: 27 mmol/L (ref 20–32)
Calcium: 10 mg/dL (ref 8.6–10.3)
Chloride: 104 mmol/L (ref 98–110)
Creat: 1.47 mg/dL — ABNORMAL HIGH (ref 0.70–1.25)
Glucose, Bld: 91 mg/dL (ref 65–99)
Potassium: 4.4 mmol/L (ref 3.5–5.3)
Sodium: 138 mmol/L (ref 135–146)

## 2019-12-10 LAB — TSH: TSH: 7.43 mIU/L — ABNORMAL HIGH (ref 0.40–4.50)

## 2019-12-10 LAB — T3, FREE: T3, Free: 3.5 pg/mL (ref 2.3–4.2)

## 2019-12-10 LAB — T4, FREE: Free T4: 1.2 ng/dL (ref 0.8–1.8)

## 2019-12-11 NOTE — Progress Notes (Signed)
Please inform patient of the following:  Kidney function still elevated - do not have a great explanation for this. Recommend referral to neurology for elevated creatinine.  His thyroid levels are borderline. Would like for him to schedule an office visit soon to discuss risks/benefits of starting a thyroid replacement medication.  Benjamin Chambers. Jerline Pain, MD 12/11/2019 1:12 PM

## 2019-12-12 ENCOUNTER — Encounter: Payer: Self-pay | Admitting: Family Medicine

## 2019-12-15 ENCOUNTER — Other Ambulatory Visit: Payer: Self-pay

## 2019-12-15 DIAGNOSIS — R7989 Other specified abnormal findings of blood chemistry: Secondary | ICD-10-CM

## 2019-12-30 ENCOUNTER — Encounter: Payer: Self-pay | Admitting: Family Medicine

## 2019-12-30 ENCOUNTER — Ambulatory Visit (INDEPENDENT_AMBULATORY_CARE_PROVIDER_SITE_OTHER): Payer: BC Managed Care – PPO | Admitting: Family Medicine

## 2019-12-30 ENCOUNTER — Other Ambulatory Visit: Payer: Self-pay

## 2019-12-30 DIAGNOSIS — R7989 Other specified abnormal findings of blood chemistry: Secondary | ICD-10-CM | POA: Insufficient documentation

## 2019-12-30 DIAGNOSIS — R351 Nocturia: Secondary | ICD-10-CM

## 2019-12-30 DIAGNOSIS — E039 Hypothyroidism, unspecified: Secondary | ICD-10-CM

## 2019-12-30 DIAGNOSIS — E038 Other specified hypothyroidism: Secondary | ICD-10-CM

## 2019-12-30 MED ORDER — LEVOTHYROXINE SODIUM 25 MCG PO TABS: 25.0000 ug | ORAL_TABLET | Freq: Every day | ORAL | 1 refills | Status: DC

## 2019-12-30 NOTE — Assessment & Plan Note (Signed)
Referral to nephrology is pending.

## 2019-12-30 NOTE — Progress Notes (Signed)
   Benjamin Chambers. is a 69 y.o. male who presents today for an office visit.  Assessment/Plan:  Chronic Problems Addressed Today: Elevated serum creatinine Referral to nephrology is pending.  Subclinical hypothyroidism Had extensive discussion with patient regarding risk/benefits of starting Synthroid.  We will start Synthroid 25 mcg daily.  He will follow-up in 4 weeks to recheck TSH.  Discussed potential side effects.   Nocturia Slightly better with Flomax.  Has referral to urology pending.     Subjective:  HPI:  See A/p.         Objective:  Physical Exam: BP 131/86   Pulse 74   Temp (!) 97.3 F (36.3 C) (Temporal)   Ht 5' 11.5" (1.816 m)   Wt 190 lb 9.6 oz (86.5 kg)   SpO2 100%   BMI 26.21 kg/m   Gen: No acute distress, resting comfortably Neuro: Grossly normal, moves all extremities Psych: Normal affect and thought content      Benjamin Chambers M. Jerline Pain, MD 12/30/2019 10:02 AM

## 2019-12-30 NOTE — Patient Instructions (Addendum)
It was very nice to see you today!  Please start levothyroxine 25 mcg daily.  Please come back in 4 weeks to recheck your TSH.  Please come back soon for your flu vaccine.  Take care, Dr Jerline Pain  Please try these tips to maintain a healthy lifestyle:   Eat at least 3 REAL meals and 1-2 snacks per day.  Aim for no more than 5 hours between eating.  If you eat breakfast, please do so within one hour of getting up.    Each meal should contain half fruits/vegetables, one quarter protein, and one quarter carbs (no bigger than a computer mouse)   Cut down on sweet beverages. This includes juice, soda, and sweet tea.     Drink at least 1 glass of water with each meal and aim for at least 8 glasses per day   Exercise at least 150 minutes every week.

## 2019-12-30 NOTE — Assessment & Plan Note (Addendum)
Had extensive discussion with patient regarding risk/benefits of starting Synthroid.  We will start Synthroid 25 mcg daily.  He will follow-up in 4 weeks to recheck TSH.  Discussed potential side effects.

## 2019-12-30 NOTE — Assessment & Plan Note (Addendum)
Slightly better with Flomax.  Has referral to urology pending.

## 2020-01-01 DIAGNOSIS — H401131 Primary open-angle glaucoma, bilateral, mild stage: Secondary | ICD-10-CM | POA: Diagnosis not present

## 2020-01-01 DIAGNOSIS — H31011 Macula scars of posterior pole (postinflammatory) (post-traumatic), right eye: Secondary | ICD-10-CM | POA: Diagnosis not present

## 2020-01-01 DIAGNOSIS — H35363 Drusen (degenerative) of macula, bilateral: Secondary | ICD-10-CM | POA: Diagnosis not present

## 2020-01-01 DIAGNOSIS — H2513 Age-related nuclear cataract, bilateral: Secondary | ICD-10-CM | POA: Diagnosis not present

## 2020-01-05 ENCOUNTER — Telehealth: Payer: Self-pay

## 2020-01-05 NOTE — Telephone Encounter (Signed)
Patient calls in this morning wondering how long he should wait to receive his flu vaccine after dealing with a root canal infection.

## 2020-01-05 NOTE — Telephone Encounter (Signed)
Called and lm for pt tcb. 

## 2020-01-06 ENCOUNTER — Ambulatory Visit: Payer: BC Managed Care – PPO

## 2020-01-06 NOTE — Telephone Encounter (Signed)
Called and lm for pt tcb. 

## 2020-01-09 DIAGNOSIS — Z23 Encounter for immunization: Secondary | ICD-10-CM | POA: Diagnosis not present

## 2020-01-20 DIAGNOSIS — Z23 Encounter for immunization: Secondary | ICD-10-CM | POA: Diagnosis not present

## 2020-01-21 ENCOUNTER — Other Ambulatory Visit: Payer: Self-pay | Admitting: Family Medicine

## 2020-01-23 DIAGNOSIS — H401131 Primary open-angle glaucoma, bilateral, mild stage: Secondary | ICD-10-CM | POA: Diagnosis not present

## 2020-01-27 ENCOUNTER — Other Ambulatory Visit: Payer: Self-pay

## 2020-01-27 ENCOUNTER — Other Ambulatory Visit: Payer: BC Managed Care – PPO

## 2020-01-27 DIAGNOSIS — E038 Other specified hypothyroidism: Secondary | ICD-10-CM

## 2020-01-27 LAB — TSH: TSH: 4.23 mIU/L (ref 0.40–4.50)

## 2020-01-27 NOTE — Progress Notes (Signed)
Please inform patient of the following:  Thyroid level is much better. Would like for him to continue current dose and we can recheck again in 6-12 months.  Benjamin Chambers. Jerline Pain, MD 01/27/2020 1:43 PM

## 2020-01-30 DIAGNOSIS — I129 Hypertensive chronic kidney disease with stage 1 through stage 4 chronic kidney disease, or unspecified chronic kidney disease: Secondary | ICD-10-CM | POA: Diagnosis not present

## 2020-01-30 DIAGNOSIS — N1831 Chronic kidney disease, stage 3a: Secondary | ICD-10-CM | POA: Diagnosis not present

## 2020-02-03 ENCOUNTER — Encounter: Payer: Self-pay | Admitting: Family Medicine

## 2020-02-03 ENCOUNTER — Other Ambulatory Visit: Payer: Self-pay | Admitting: Nephrology

## 2020-02-03 DIAGNOSIS — N1831 Chronic kidney disease, stage 3a: Secondary | ICD-10-CM

## 2020-02-04 DIAGNOSIS — L814 Other melanin hyperpigmentation: Secondary | ICD-10-CM | POA: Diagnosis not present

## 2020-02-04 DIAGNOSIS — L570 Actinic keratosis: Secondary | ICD-10-CM | POA: Diagnosis not present

## 2020-02-04 DIAGNOSIS — Z85828 Personal history of other malignant neoplasm of skin: Secondary | ICD-10-CM | POA: Diagnosis not present

## 2020-02-04 DIAGNOSIS — Z872 Personal history of diseases of the skin and subcutaneous tissue: Secondary | ICD-10-CM | POA: Diagnosis not present

## 2020-02-04 DIAGNOSIS — D225 Melanocytic nevi of trunk: Secondary | ICD-10-CM | POA: Diagnosis not present

## 2020-02-04 DIAGNOSIS — B078 Other viral warts: Secondary | ICD-10-CM | POA: Diagnosis not present

## 2020-02-04 DIAGNOSIS — L821 Other seborrheic keratosis: Secondary | ICD-10-CM | POA: Diagnosis not present

## 2020-02-04 DIAGNOSIS — Z08 Encounter for follow-up examination after completed treatment for malignant neoplasm: Secondary | ICD-10-CM | POA: Diagnosis not present

## 2020-02-10 ENCOUNTER — Ambulatory Visit
Admission: RE | Admit: 2020-02-10 | Discharge: 2020-02-10 | Disposition: A | Discharge status: Home / Self Care | Payer: BC Managed Care – PPO | Source: Ambulatory Visit | Attending: Nephrology | Admitting: Nephrology

## 2020-02-10 DIAGNOSIS — N1831 Chronic kidney disease, stage 3a: Secondary | ICD-10-CM

## 2020-02-11 DIAGNOSIS — R338 Other retention of urine: Secondary | ICD-10-CM | POA: Diagnosis not present

## 2020-02-11 DIAGNOSIS — Z125 Encounter for screening for malignant neoplasm of prostate: Secondary | ICD-10-CM | POA: Diagnosis not present

## 2020-02-11 DIAGNOSIS — R350 Frequency of micturition: Secondary | ICD-10-CM | POA: Diagnosis not present

## 2020-02-11 DIAGNOSIS — N130 Hydronephrosis with ureteropelvic junction obstruction: Secondary | ICD-10-CM | POA: Diagnosis not present

## 2020-02-11 DIAGNOSIS — N401 Enlarged prostate with lower urinary tract symptoms: Secondary | ICD-10-CM | POA: Diagnosis not present

## 2020-02-11 DIAGNOSIS — R351 Nocturia: Secondary | ICD-10-CM | POA: Diagnosis not present

## 2020-02-16 ENCOUNTER — Other Ambulatory Visit: Payer: Self-pay | Admitting: Family Medicine

## 2020-02-24 DIAGNOSIS — N130 Hydronephrosis with ureteropelvic junction obstruction: Secondary | ICD-10-CM | POA: Diagnosis not present

## 2020-02-28 ENCOUNTER — Other Ambulatory Visit: Payer: Self-pay | Admitting: Family Medicine

## 2020-02-29 ENCOUNTER — Encounter: Payer: Self-pay | Admitting: Family Medicine

## 2020-03-01 ENCOUNTER — Encounter: Payer: Self-pay | Admitting: Family Medicine

## 2020-03-01 NOTE — Telephone Encounter (Signed)
See preview note

## 2020-03-01 NOTE — Telephone Encounter (Signed)
FYI

## 2020-03-09 ENCOUNTER — Other Ambulatory Visit: Payer: Self-pay | Admitting: Family Medicine

## 2020-03-10 DIAGNOSIS — R338 Other retention of urine: Secondary | ICD-10-CM | POA: Diagnosis not present

## 2020-03-18 DIAGNOSIS — R338 Other retention of urine: Secondary | ICD-10-CM | POA: Diagnosis not present

## 2020-03-18 DIAGNOSIS — R39198 Other difficulties with micturition: Secondary | ICD-10-CM | POA: Diagnosis not present

## 2020-03-18 DIAGNOSIS — N401 Enlarged prostate with lower urinary tract symptoms: Secondary | ICD-10-CM | POA: Diagnosis not present

## 2020-03-18 DIAGNOSIS — N130 Hydronephrosis with ureteropelvic junction obstruction: Secondary | ICD-10-CM | POA: Diagnosis not present

## 2020-03-22 DIAGNOSIS — M62838 Other muscle spasm: Secondary | ICD-10-CM | POA: Diagnosis not present

## 2020-03-22 DIAGNOSIS — R351 Nocturia: Secondary | ICD-10-CM | POA: Diagnosis not present

## 2020-03-22 DIAGNOSIS — R350 Frequency of micturition: Secondary | ICD-10-CM | POA: Diagnosis not present

## 2020-03-22 DIAGNOSIS — M6281 Muscle weakness (generalized): Secondary | ICD-10-CM | POA: Diagnosis not present

## 2020-03-22 DIAGNOSIS — M6289 Other specified disorders of muscle: Secondary | ICD-10-CM | POA: Diagnosis not present

## 2020-03-31 DIAGNOSIS — R338 Other retention of urine: Secondary | ICD-10-CM | POA: Diagnosis not present

## 2020-03-31 DIAGNOSIS — N401 Enlarged prostate with lower urinary tract symptoms: Secondary | ICD-10-CM | POA: Diagnosis not present

## 2020-03-31 DIAGNOSIS — R39198 Other difficulties with micturition: Secondary | ICD-10-CM | POA: Diagnosis not present

## 2020-04-02 ENCOUNTER — Other Ambulatory Visit: Payer: Self-pay | Admitting: Family Medicine

## 2020-04-11 ENCOUNTER — Encounter: Payer: Self-pay | Admitting: Family Medicine

## 2020-04-15 DIAGNOSIS — N401 Enlarged prostate with lower urinary tract symptoms: Secondary | ICD-10-CM | POA: Diagnosis not present

## 2020-04-15 DIAGNOSIS — R350 Frequency of micturition: Secondary | ICD-10-CM | POA: Diagnosis not present

## 2020-04-15 DIAGNOSIS — N130 Hydronephrosis with ureteropelvic junction obstruction: Secondary | ICD-10-CM | POA: Diagnosis not present

## 2020-04-15 DIAGNOSIS — R338 Other retention of urine: Secondary | ICD-10-CM | POA: Diagnosis not present

## 2020-04-26 ENCOUNTER — Encounter: Payer: Self-pay | Admitting: Family Medicine

## 2020-04-29 ENCOUNTER — Other Ambulatory Visit: Payer: Self-pay | Admitting: Family Medicine

## 2020-04-29 ENCOUNTER — Other Ambulatory Visit: Payer: Self-pay | Admitting: Urology

## 2020-05-17 DIAGNOSIS — N130 Hydronephrosis with ureteropelvic junction obstruction: Secondary | ICD-10-CM | POA: Diagnosis not present

## 2020-05-22 ENCOUNTER — Other Ambulatory Visit: Payer: Self-pay | Admitting: Family Medicine

## 2020-05-24 LAB — BASIC METABOLIC PANEL
BUN: 15 (ref 4–21)
CO2: 28 — AB (ref 13–22)
Chloride: 104 (ref 99–108)
Creatinine: 1.2 (ref 0.6–1.3)
Glucose: 96
Potassium: 4.3 (ref 3.4–5.3)
Sodium: 140 (ref 137–147)

## 2020-05-24 LAB — COMPREHENSIVE METABOLIC PANEL
Albumin: 4.2 (ref 3.5–5.0)
Calcium: 9.8 (ref 8.7–10.7)
GFR calc Af Amer: 73
GFR calc non Af Amer: 63

## 2020-05-27 DIAGNOSIS — H25813 Combined forms of age-related cataract, bilateral: Secondary | ICD-10-CM | POA: Diagnosis not present

## 2020-05-27 DIAGNOSIS — H401131 Primary open-angle glaucoma, bilateral, mild stage: Secondary | ICD-10-CM | POA: Diagnosis not present

## 2020-06-01 DIAGNOSIS — N1831 Chronic kidney disease, stage 3a: Secondary | ICD-10-CM | POA: Diagnosis not present

## 2020-06-01 DIAGNOSIS — N400 Enlarged prostate without lower urinary tract symptoms: Secondary | ICD-10-CM | POA: Diagnosis not present

## 2020-06-01 DIAGNOSIS — I129 Hypertensive chronic kidney disease with stage 1 through stage 4 chronic kidney disease, or unspecified chronic kidney disease: Secondary | ICD-10-CM | POA: Diagnosis not present

## 2020-06-07 ENCOUNTER — Encounter: Payer: Self-pay | Admitting: Family Medicine

## 2020-06-07 NOTE — Patient Instructions (Addendum)
DUE TO COVID-19 ONLY ONE VISITOR IS ALLOWED TO COME WITH YOU AND STAY IN THE WAITING ROOM ONLY DURING PRE OP AND PROCEDURE DAY OF SURGERY. THE 1 VISITOR  MAY VISIT WITH YOU AFTER SURGERY IN YOUR PRIVATE ROOM DURING VISITING HOURS ONLY!  YOU NEED TO HAVE A COVID 19 TEST ON: 06/15/20 @ 8:30 AM, THIS TEST MUST BE DONE BEFORE SURGERY,  COVID TESTING SITE [place] [address], IT IS ON THE RIGHT GOING OUT [address] APPROXIMATELY  2 MINUTES PAST [employer] ON THE RIGHT. ONCE YOUR COVID TEST IS COMPLETED,  PLEASE BEGIN THE QUARANTINE INSTRUCTIONS AS OUTLINED IN YOUR HANDOUT.                Benjamin Chambers.   Your procedure is scheduled on: 06/18/20   Report to [facility] Main  Entrance   Report to admitting at: 10:45 AM     Call this number if you have problems the morning of surgery [phone]    Remember:   NO SOLID FOOD AFTER MIDNIGHT THE NIGHT PRIOR TO SURGERY. NOTHING BY MOUTH EXCEPT CLEAR LIQUIDS UNTIL: 9:45 AM .   CLEAR LIQUID DIET  Foods Allowed                                                                     Foods Excluded  Coffee and tea, regular and decaf                             liquids that you cannot  Plain Jell-O any favor except red or purple                                           see through such as: Fruit ices (not with fruit pulp)                                     milk, soups, orange juice  Iced Popsicles                                    All solid food Carbonated beverages, regular and diet                                    Cranberry, grape and apple juices Sports drinks like Gatorade Lightly seasoned clear broth or consume(fat free) Sugar, honey syrup  Sample Menu Breakfast                                Lunch                                     Supper Cranberry juice  Beef broth                            Chicken broth Jell-O                                     Grape juice                            Apple juice Coffee or tea                        Jell-O                                      Popsicle                                                Coffee or tea                        Coffee or tea  _____________________________________________________________________   BRUSH YOUR TEETH MORNING OF SURGERY AND RINSE YOUR MOUTH OUT, NO CHEWING GUM CANDY OR MINTS.    Take these medicines the morning of surgery with A SIP OF WATER: synthroid,tamsulosin,mesalamine.Use eye drops as usual.                               You may not have any metal on your body including hair pins and              piercings  Do not wear jewelry, lotions, powders or perfumes, deodorant             Men may shave face and neck.   Do not bring valuables to the hospital. [place].  Contacts, dentures or bridgework may not be worn into surgery.  Leave suitcase in the car. After surgery it may be brought to your room.     Patients discharged the day of surgery will not be allowed to drive home. IF YOU ARE HAVING SURGERY AND GOING HOME THE SAME DAY, YOU MUST HAVE AN ADULT TO DRIVE YOU HOME AND BE WITH YOU FOR 24 HOURS. YOU MAY GO HOME BY TAXI OR UBER OR ORTHERWISE, BUT AN ADULT MUST ACCOMPANY YOU HOME AND STAY WITH YOU FOR 24 HOURS.  Name and phone number of your driver:  Special Instructions: N/A              Please read over the following fact sheets you were given: _____________________________________________________________________          [facility] - Preparing for Surgery Before surgery, you can play an important role.  Because skin is not sterile, your skin needs to be as free of germs as possible.  You can reduce the number of germs on your skin by washing with CHG (chlorahexidine gluconate) soap before surgery.  CHG is an antiseptic cleaner which kills germs and bonds with the skin  to continue killing germs even after washing. Please DO NOT use  if you have an allergy to CHG or antibacterial soaps.  If your skin becomes reddened/irritated stop using the CHG and inform your nurse when you arrive at Short Stay. Do not shave (including legs and underarms) for at least 48 hours prior to the first CHG shower.  You may shave your face/neck. Please follow these instructions carefully:  1.  Shower with CHG Soap the night before surgery and the  morning of Surgery.  2.  If you choose to wash your hair, wash your hair first as usual with your  normal  shampoo.  3.  After you shampoo, rinse your hair and body thoroughly to remove the  shampoo.                           4.  Use CHG as you would any other liquid soap.  You can apply chg directly  to the skin and wash                       Gently with a scrungie or clean washcloth.  5.  Apply the CHG Soap to your body ONLY FROM THE NECK DOWN.   Do not use on face/ open                           Wound or open sores. Avoid contact with eyes, ears mouth and genitals (private parts).                       Wash face,  Genitals (private parts) with your normal soap.             6.  Wash thoroughly, paying special attention to the area where your surgery  will be performed.  7.  Thoroughly rinse your body with warm water from the neck down.  8.  DO NOT shower/wash with your normal soap after using and rinsing off  the CHG Soap.                9.  Pat yourself dry with a clean towel.            10.  Wear clean pajamas.            11.  Place clean sheets on your bed the night of your first shower and do not  sleep with pets. Day of Surgery : Do not apply any lotions/deodorants the morning of surgery.  Please wear clean clothes to the hospital/surgery center.  FAILURE TO FOLLOW THESE INSTRUCTIONS MAY RESULT IN THE CANCELLATION OF YOUR SURGERY PATIENT SIGNATURE_________________________________  NURSE  SIGNATURE__________________________________  ________________________________________________________________________

## 2020-06-08 ENCOUNTER — Other Ambulatory Visit: Payer: Self-pay

## 2020-06-08 ENCOUNTER — Encounter (HOSPITAL_COMMUNITY)
Admission: RE | Admit: 2020-06-08 | Discharge: 2020-06-08 | Disposition: A | Discharge status: Home / Self Care | Payer: BC Managed Care – PPO | Source: Ambulatory Visit | Attending: Urology | Admitting: Urology

## 2020-06-08 ENCOUNTER — Encounter (HOSPITAL_COMMUNITY): Payer: Self-pay

## 2020-06-08 DIAGNOSIS — Z01818 Encounter for other preprocedural examination: Secondary | ICD-10-CM | POA: Diagnosis not present

## 2020-06-08 HISTORY — DX: Hypothyroidism, unspecified: E03.9

## 2020-06-08 HISTORY — DX: Malignant (primary) neoplasm, unspecified: C80.1

## 2020-06-08 HISTORY — DX: Anemia, unspecified: D64.9

## 2020-06-08 LAB — CBC
HCT: 45 % (ref 39.0–52.0)
Hemoglobin: 15 g/dL (ref 13.0–17.0)
MCH: 28.5 pg (ref 26.0–34.0)
MCHC: 33.3 g/dL (ref 30.0–36.0)
MCV: 85.4 fL (ref 80.0–100.0)
Platelets: 262 10*3/uL (ref 150–400)
RBC: 5.27 MIL/uL (ref 4.22–5.81)
RDW: 13.8 % (ref 11.5–15.5)
WBC: 7.3 10*3/uL (ref 4.0–10.5)
nRBC: 0 % (ref 0.0–0.2)

## 2020-06-08 LAB — BASIC METABOLIC PANEL
Anion gap: 6 (ref 5–15)
BUN: 21 mg/dL (ref 8–23)
CO2: 26 mmol/L (ref 22–32)
Calcium: 9.6 mg/dL (ref 8.9–10.3)
Chloride: 106 mmol/L (ref 98–111)
Creatinine, Ser: 1.33 mg/dL — ABNORMAL HIGH (ref 0.61–1.24)
GFR, Estimated: 58 mL/min — ABNORMAL LOW (ref >60)
Glucose, Bld: 96 mg/dL (ref 70–99)
Potassium: 3.9 mmol/L (ref 3.5–5.1)
Sodium: 138 mmol/L (ref 135–145)

## 2020-06-08 NOTE — Progress Notes (Signed)
COVID Vaccine Completed: Yes Date COVID Vaccine completed: 01/20/20. [place] COVID vaccine manufacturer: Pfizer     PCP - Dr. Dimas Chyle. LOV: 12/30/19 Cardiologist -   Chest x-ray -  EKG -  Stress Test -  ECHO -  Cardiac Cath -  Pacemaker/ICD device last checked:  Sleep Study -  CPAP -   Fasting Blood Sugar -  Checks Blood Sugar _____ times a day  Blood Thinner Instructions: Aspirin Instructions: Last Dose:  Anesthesia review:   Patient denies shortness of breath, fever, cough and chest pain at PAT appointment   Patient verbalized understanding of instructions that were given to them at the PAT appointment. Patient was also instructed that they will need to review over the PAT instructions again at home before surgery.

## 2020-06-15 ENCOUNTER — Other Ambulatory Visit (HOSPITAL_COMMUNITY)
Admission: RE | Admit: 2020-06-15 | Discharge: 2020-06-15 | Disposition: A | Discharge status: Home / Self Care | Payer: BC Managed Care – PPO | Source: Ambulatory Visit | Attending: Urology | Admitting: Urology

## 2020-06-15 DIAGNOSIS — Z01812 Encounter for preprocedural laboratory examination: Secondary | ICD-10-CM | POA: Insufficient documentation

## 2020-06-15 DIAGNOSIS — Z20822 Contact with and (suspected) exposure to covid-19: Secondary | ICD-10-CM | POA: Insufficient documentation

## 2020-06-15 LAB — SARS CORONAVIRUS 2 (TAT 6-24 HRS): SARS Coronavirus 2: NEGATIVE

## 2020-06-17 ENCOUNTER — Other Ambulatory Visit: Payer: Self-pay | Admitting: Family Medicine

## 2020-06-17 NOTE — Progress Notes (Signed)
Pt aware to arrive at [facility] admitting dept at [place] on Friday 06/18/2020 for scheduled surgical procedure. No food after midnight; clear liquids from midnight till 0845 then nothing by mouth.

## 2020-06-18 ENCOUNTER — Encounter (HOSPITAL_COMMUNITY)
Admission: RE | Disposition: A | Discharge status: Home / Self Care | Payer: Self-pay | Source: Ambulatory Visit | Attending: Urology

## 2020-06-18 ENCOUNTER — Ambulatory Visit (HOSPITAL_COMMUNITY): Payer: BC Managed Care – PPO | Admitting: Anesthesiology

## 2020-06-18 ENCOUNTER — Ambulatory Visit (HOSPITAL_COMMUNITY)
Admission: RE | Admit: 2020-06-18 | Discharge: 2020-06-19 | Disposition: A | Discharge status: Home / Self Care | Payer: BC Managed Care – PPO | Source: Ambulatory Visit | Attending: Urology | Admitting: Urology

## 2020-06-18 ENCOUNTER — Encounter (HOSPITAL_COMMUNITY): Payer: Self-pay | Admitting: Urology

## 2020-06-18 DIAGNOSIS — N138 Other obstructive and reflux uropathy: Secondary | ICD-10-CM

## 2020-06-18 DIAGNOSIS — R3912 Poor urinary stream: Secondary | ICD-10-CM | POA: Diagnosis not present

## 2020-06-18 DIAGNOSIS — R338 Other retention of urine: Secondary | ICD-10-CM | POA: Insufficient documentation

## 2020-06-18 DIAGNOSIS — R3915 Urgency of urination: Secondary | ICD-10-CM | POA: Diagnosis not present

## 2020-06-18 DIAGNOSIS — I10 Essential (primary) hypertension: Secondary | ICD-10-CM | POA: Diagnosis not present

## 2020-06-18 DIAGNOSIS — Z885 Allergy status to narcotic agent status: Secondary | ICD-10-CM | POA: Diagnosis not present

## 2020-06-18 DIAGNOSIS — Z8249 Family history of ischemic heart disease and other diseases of the circulatory system: Secondary | ICD-10-CM | POA: Diagnosis not present

## 2020-06-18 DIAGNOSIS — E785 Hyperlipidemia, unspecified: Secondary | ICD-10-CM | POA: Diagnosis not present

## 2020-06-18 DIAGNOSIS — Z91018 Allergy to other foods: Secondary | ICD-10-CM | POA: Insufficient documentation

## 2020-06-18 DIAGNOSIS — N401 Enlarged prostate with lower urinary tract symptoms: Secondary | ICD-10-CM | POA: Insufficient documentation

## 2020-06-18 DIAGNOSIS — Z880 Allergy status to penicillin: Secondary | ICD-10-CM | POA: Insufficient documentation

## 2020-06-18 DIAGNOSIS — N320 Bladder-neck obstruction: Secondary | ICD-10-CM | POA: Insufficient documentation

## 2020-06-18 DIAGNOSIS — Z79899 Other long term (current) drug therapy: Secondary | ICD-10-CM | POA: Insufficient documentation

## 2020-06-18 DIAGNOSIS — Z833 Family history of diabetes mellitus: Secondary | ICD-10-CM | POA: Insufficient documentation

## 2020-06-18 DIAGNOSIS — Z8349 Family history of other endocrine, nutritional and metabolic diseases: Secondary | ICD-10-CM | POA: Diagnosis not present

## 2020-06-18 DIAGNOSIS — R350 Frequency of micturition: Secondary | ICD-10-CM | POA: Diagnosis not present

## 2020-06-18 DIAGNOSIS — N400 Enlarged prostate without lower urinary tract symptoms: Secondary | ICD-10-CM | POA: Diagnosis present

## 2020-06-18 DIAGNOSIS — Z87891 Personal history of nicotine dependence: Secondary | ICD-10-CM | POA: Diagnosis not present

## 2020-06-18 DIAGNOSIS — R351 Nocturia: Secondary | ICD-10-CM | POA: Diagnosis not present

## 2020-06-18 LAB — CBC
HCT: 44.2 % (ref 39.0–52.0)
Hemoglobin: 14.8 g/dL (ref 13.0–17.0)
MCH: 28.6 pg (ref 26.0–34.0)
MCHC: 33.5 g/dL (ref 30.0–36.0)
MCV: 85.5 fL (ref 80.0–100.0)
Platelets: 244 10*3/uL (ref 150–400)
RBC: 5.17 MIL/uL (ref 4.22–5.81)
RDW: 13.9 % (ref 11.5–15.5)
WBC: 7.9 10*3/uL (ref 4.0–10.5)
nRBC: 0 % (ref 0.0–0.2)

## 2020-06-18 LAB — CREATININE, SERUM
Creatinine, Ser: 1.08 mg/dL (ref 0.61–1.24)
GFR, Estimated: >60 mL/min (ref >60)

## 2020-06-18 SURGERY — Holmium Laser Enucleation of the Prostate with Morcellation
Anesthesia: General

## 2020-06-18 MED ORDER — LIDOCAINE 2% (20 MG/ML) 5 ML SYRINGE
INTRAMUSCULAR | Status: DC | PRN
  Administered 2020-06-18: 60 mg via INTRAVENOUS

## 2020-06-18 MED ORDER — LABETALOL HCL 5 MG/ML IV SOLN
10.0000 mg | INTRAVENOUS | Status: AC | PRN
Start: 2020-06-18 — End: 2020-06-18
  Administered 2020-06-18 (×2): 10 mg via INTRAVENOUS

## 2020-06-18 MED ORDER — MESALAMINE 1.2 G PO TBEC
2.4000 g | DELAYED_RELEASE_TABLET | Freq: Every day | ORAL | Status: DC
  Administered 2020-06-19: 2.4 g via ORAL
  Filled 2020-06-18 (×2): qty 2

## 2020-06-18 MED ORDER — ONDANSETRON HCL 4 MG/2ML IJ SOLN: 4.0000 mg | INTRAMUSCULAR | Status: DC | PRN

## 2020-06-18 MED ORDER — CHLORHEXIDINE GLUCONATE CLOTH 2 % EX PADS
6.0000 | MEDICATED_PAD | Freq: Every day | CUTANEOUS | Status: DC
  Administered 2020-06-19: 6 via TOPICAL

## 2020-06-18 MED ORDER — ORAL CARE MOUTH RINSE: 15.0000 mL | Freq: Once | OROMUCOSAL | Status: AC

## 2020-06-18 MED ORDER — BRIMONIDINE TARTRATE-TIMOLOL 0.2-0.5 % OP SOLN: 1.0000 [drp] | Freq: Two times a day (BID) | OPHTHALMIC | Status: DC

## 2020-06-18 MED ORDER — OXYBUTYNIN CHLORIDE ER 10 MG PO TB24
10.0000 mg | ORAL_TABLET | Freq: Every day | ORAL | Status: DC
  Administered 2020-06-18: 10 mg via ORAL
  Filled 2020-06-18 (×2): qty 1

## 2020-06-18 MED ORDER — EPHEDRINE SULFATE-NACL 50-0.9 MG/10ML-% IV SOSY
PREFILLED_SYRINGE | INTRAVENOUS | Status: DC | PRN
  Administered 2020-06-18 (×2): 10 mg via INTRAVENOUS
  Administered 2020-06-18: 5 mg via INTRAVENOUS

## 2020-06-18 MED ORDER — CEFAZOLIN SODIUM-DEXTROSE 2-4 GM/100ML-% IV SOLN
INTRAVENOUS | Status: AC
  Filled 2020-06-18: qty 100

## 2020-06-18 MED ORDER — SODIUM CHLORIDE 0.9 % IR SOLN: 3000.0000 mL | Status: DC

## 2020-06-18 MED ORDER — LIDOCAINE 2% (20 MG/ML) 5 ML SYRINGE
INTRAMUSCULAR | Status: AC
  Filled 2020-06-18: qty 5

## 2020-06-18 MED ORDER — SODIUM CHLORIDE 0.9 % IR SOLN
Status: DC | PRN
  Administered 2020-06-18: 9000 mL via INTRAVESICAL
  Administered 2020-06-18: 6000 mL via INTRAVESICAL
  Administered 2020-06-18 (×2): 9000 mL via INTRAVESICAL
  Administered 2020-06-18: 3000 mL via INTRAVESICAL
  Administered 2020-06-18 (×2): 6000 mL via INTRAVESICAL
  Administered 2020-06-18: 3000 mL via INTRAVESICAL

## 2020-06-18 MED ORDER — LATANOPROST 0.005 % OP SOLN
1.0000 [drp] | Freq: Every day | OPHTHALMIC | Status: DC
  Administered 2020-06-18: 1 [drp] via OPHTHALMIC
  Filled 2020-06-18: qty 2.5

## 2020-06-18 MED ORDER — DIPHENHYDRAMINE HCL 50 MG/ML IJ SOLN: 12.5000 mg | Freq: Four times a day (QID) | INTRAMUSCULAR | Status: DC | PRN

## 2020-06-18 MED ORDER — SODIUM CHLORIDE 0.9 % IR SOLN
Status: DC | PRN
  Administered 2020-06-18: 1000 mL via INTRAVESICAL

## 2020-06-18 MED ORDER — PHENYLEPHRINE 40 MCG/ML (10ML) SYRINGE FOR IV PUSH (FOR BLOOD PRESSURE SUPPORT)
PREFILLED_SYRINGE | INTRAVENOUS | Status: DC | PRN
  Administered 2020-06-18: 120 ug via INTRAVENOUS

## 2020-06-18 MED ORDER — DEXAMETHASONE SODIUM PHOSPHATE 10 MG/ML IJ SOLN
INTRAMUSCULAR | Status: DC | PRN
  Administered 2020-06-18: 5 mg via INTRAVENOUS

## 2020-06-18 MED ORDER — OXYCODONE-ACETAMINOPHEN 5-325 MG PO TABS: 1.0000 | ORAL_TABLET | ORAL | Status: DC | PRN

## 2020-06-18 MED ORDER — LACTATED RINGERS IV SOLN: INTRAVENOUS | Status: DC

## 2020-06-18 MED ORDER — EPHEDRINE 5 MG/ML INJ
INTRAVENOUS | Status: AC
  Filled 2020-06-18: qty 10

## 2020-06-18 MED ORDER — HEPARIN SODIUM (PORCINE) 5000 UNIT/ML IJ SOLN
5000.0000 [IU] | Freq: Three times a day (TID) | INTRAMUSCULAR | Status: DC
  Administered 2020-06-18 – 2020-06-19 (×2): 5000 [IU] via SUBCUTANEOUS
  Filled 2020-06-18 (×2): qty 1

## 2020-06-18 MED ORDER — SODIUM CHLORIDE 0.9% FLUSH: 3.0000 mL | INTRAVENOUS | Status: DC | PRN

## 2020-06-18 MED ORDER — FENTANYL CITRATE (PF) 250 MCG/5ML IJ SOLN
INTRAMUSCULAR | Status: DC | PRN
  Administered 2020-06-18 (×2): 25 ug via INTRAVENOUS
  Administered 2020-06-18: 100 ug via INTRAVENOUS
  Administered 2020-06-18 (×2): 50 ug via INTRAVENOUS

## 2020-06-18 MED ORDER — HYDROMORPHONE HCL 1 MG/ML IJ SOLN
0.5000 mg | INTRAMUSCULAR | Status: DC | PRN
Start: 2020-06-18 — End: 2020-06-19

## 2020-06-18 MED ORDER — ROCURONIUM BROMIDE 10 MG/ML (PF) SYRINGE
PREFILLED_SYRINGE | INTRAVENOUS | Status: AC
  Filled 2020-06-18: qty 10

## 2020-06-18 MED ORDER — ZOLPIDEM TARTRATE 5 MG PO TABS: 5.0000 mg | ORAL_TABLET | Freq: Every evening | ORAL | Status: DC | PRN

## 2020-06-18 MED ORDER — POTASSIUM CHLORIDE IN NACL 20-0.45 MEQ/L-% IV SOLN
INTRAVENOUS | Status: DC
  Filled 2020-06-18 (×2): qty 1000

## 2020-06-18 MED ORDER — CEFAZOLIN SODIUM-DEXTROSE 2-4 GM/100ML-% IV SOLN
2.0000 g | Freq: Once | INTRAVENOUS | Status: AC
  Administered 2020-06-18: 2 g via INTRAVENOUS

## 2020-06-18 MED ORDER — DIPHENHYDRAMINE HCL 12.5 MG/5ML PO ELIX: 12.5000 mg | ORAL_SOLUTION | Freq: Four times a day (QID) | ORAL | Status: DC | PRN

## 2020-06-18 MED ORDER — CHLORHEXIDINE GLUCONATE 0.12 % MT SOLN
15.0000 mL | Freq: Once | OROMUCOSAL | Status: AC
  Administered 2020-06-18: 15 mL via OROMUCOSAL

## 2020-06-18 MED ORDER — DEXAMETHASONE SODIUM PHOSPHATE 10 MG/ML IJ SOLN
INTRAMUSCULAR | Status: AC
  Filled 2020-06-18: qty 1

## 2020-06-18 MED ORDER — BELLADONNA ALKALOIDS-OPIUM 16.2-60 MG RE SUPP: 1.0000 | Freq: Four times a day (QID) | RECTAL | Status: DC | PRN

## 2020-06-18 MED ORDER — ROCURONIUM BROMIDE 10 MG/ML (PF) SYRINGE
PREFILLED_SYRINGE | INTRAVENOUS | Status: DC | PRN
  Administered 2020-06-18: 60 mg via INTRAVENOUS
  Administered 2020-06-18 (×4): 20 mg via INTRAVENOUS

## 2020-06-18 MED ORDER — LEVOTHYROXINE SODIUM 25 MCG PO TABS
25.0000 ug | ORAL_TABLET | Freq: Every day | ORAL | Status: DC
  Administered 2020-06-19: 25 ug via ORAL
  Filled 2020-06-18: qty 1

## 2020-06-18 MED ORDER — SODIUM CHLORIDE 0.9 % IV SOLN
250.0000 mL | INTRAVENOUS | Status: DC | PRN
Start: 2020-06-19 — End: 2020-06-19

## 2020-06-18 MED ORDER — ONDANSETRON HCL 4 MG/2ML IJ SOLN
INTRAMUSCULAR | Status: AC
  Filled 2020-06-18: qty 2

## 2020-06-18 MED ORDER — LABETALOL HCL 5 MG/ML IV SOLN
INTRAVENOUS | Status: AC
  Filled 2020-06-18: qty 4

## 2020-06-18 MED ORDER — FENTANYL CITRATE (PF) 100 MCG/2ML IJ SOLN: 25.0000 ug | INTRAMUSCULAR | Status: DC | PRN

## 2020-06-18 MED ORDER — ONDANSETRON HCL 4 MG/2ML IJ SOLN: 4.0000 mg | Freq: Four times a day (QID) | INTRAMUSCULAR | Status: DC | PRN

## 2020-06-18 MED ORDER — PROPOFOL 10 MG/ML IV BOLUS
INTRAVENOUS | Status: DC | PRN
  Administered 2020-06-18: 150 mg via INTRAVENOUS

## 2020-06-18 MED ORDER — LISINOPRIL 10 MG PO TABS
10.0000 mg | ORAL_TABLET | Freq: Every day | ORAL | Status: DC
  Administered 2020-06-18: 10 mg via ORAL
  Filled 2020-06-18 (×2): qty 1

## 2020-06-18 MED ORDER — DOCUSATE SODIUM 100 MG PO CAPS: 100.0000 mg | ORAL_CAPSULE | Freq: Every day | ORAL | 0 refills | Status: AC | PRN

## 2020-06-18 MED ORDER — MIDAZOLAM HCL 5 MG/5ML IJ SOLN
INTRAMUSCULAR | Status: DC | PRN
  Administered 2020-06-18: 2 mg via INTRAVENOUS

## 2020-06-18 MED ORDER — BRIMONIDINE TARTRATE 0.2 % OP SOLN
1.0000 [drp] | Freq: Two times a day (BID) | OPHTHALMIC | Status: DC
  Administered 2020-06-19: 1 [drp] via OPHTHALMIC
  Filled 2020-06-18: qty 5

## 2020-06-18 MED ORDER — OXYCODONE HCL 5 MG/5ML PO SOLN: 5.0000 mg | Freq: Once | ORAL | Status: DC | PRN

## 2020-06-18 MED ORDER — ONDANSETRON HCL 4 MG PO TABS: 4.0000 mg | ORAL_TABLET | Freq: Three times a day (TID) | ORAL | 0 refills | Status: DC | PRN

## 2020-06-18 MED ORDER — ONDANSETRON HCL 4 MG/2ML IJ SOLN
INTRAMUSCULAR | Status: DC | PRN
  Administered 2020-06-18: 4 mg via INTRAVENOUS

## 2020-06-18 MED ORDER — MIDAZOLAM HCL 2 MG/2ML IJ SOLN
INTRAMUSCULAR | Status: AC
  Filled 2020-06-18: qty 2

## 2020-06-18 MED ORDER — CARISOPRODOL 350 MG PO TABS: 350.0000 mg | ORAL_TABLET | Freq: Every day | ORAL | Status: DC | PRN

## 2020-06-18 MED ORDER — FENTANYL CITRATE (PF) 250 MCG/5ML IJ SOLN
INTRAMUSCULAR | Status: AC
  Filled 2020-06-18: qty 5

## 2020-06-18 MED ORDER — TAMSULOSIN HCL 0.4 MG PO CAPS
0.4000 mg | ORAL_CAPSULE | Freq: Every day | ORAL | Status: DC
  Filled 2020-06-18: qty 1

## 2020-06-18 MED ORDER — SUGAMMADEX SODIUM 200 MG/2ML IV SOLN
INTRAVENOUS | Status: DC | PRN
  Administered 2020-06-18: 400 mg via INTRAVENOUS

## 2020-06-18 MED ORDER — DOCUSATE SODIUM 100 MG PO CAPS
100.0000 mg | ORAL_CAPSULE | Freq: Two times a day (BID) | ORAL | Status: DC
  Administered 2020-06-18: 100 mg via ORAL
  Filled 2020-06-18 (×2): qty 1

## 2020-06-18 MED ORDER — SODIUM CHLORIDE 0.9% FLUSH
3.0000 mL | Freq: Two times a day (BID) | INTRAVENOUS | Status: DC
  Administered 2020-06-18: 3 mL via INTRAVENOUS

## 2020-06-18 MED ORDER — OXYCODONE HCL 5 MG PO TABS: 5.0000 mg | ORAL_TABLET | Freq: Once | ORAL | Status: DC | PRN

## 2020-06-18 MED ORDER — ACETAMINOPHEN 325 MG PO TABS: 650.0000 mg | ORAL_TABLET | ORAL | Status: DC | PRN

## 2020-06-18 MED ORDER — GLUCOSAMINE-CHONDROITIN 500-400 MG PO TABS: 1.0000 | ORAL_TABLET | Freq: Every day | ORAL | Status: DC

## 2020-06-18 MED ORDER — BACITRACIN-NEOMYCIN-POLYMYXIN 400-5-5000 EX OINT
1.0000 | TOPICAL_OINTMENT | Freq: Three times a day (TID) | CUTANEOUS | Status: DC | PRN
Start: 2020-06-18 — End: 2020-06-19

## 2020-06-18 MED ORDER — PHENYLEPHRINE 40 MCG/ML (10ML) SYRINGE FOR IV PUSH (FOR BLOOD PRESSURE SUPPORT)
PREFILLED_SYRINGE | INTRAVENOUS | Status: AC
  Filled 2020-06-18: qty 10

## 2020-06-18 MED ORDER — OXYCODONE-ACETAMINOPHEN 5-325 MG PO TABS: 1.0000 | ORAL_TABLET | ORAL | 0 refills | Status: DC | PRN

## 2020-06-18 MED ORDER — PROPOFOL 10 MG/ML IV BOLUS
INTRAVENOUS | Status: AC
  Filled 2020-06-18: qty 20

## 2020-06-18 MED ORDER — TIMOLOL MALEATE 0.5 % OP SOLN
1.0000 [drp] | Freq: Two times a day (BID) | OPHTHALMIC | Status: DC
  Administered 2020-06-19: 1 [drp] via OPHTHALMIC
  Filled 2020-06-18: qty 5

## 2020-06-18 SURGICAL SUPPLY — 32 items
ADAPTER IRRIG TUBE 2 SPIKE SOL (ADAPTER) ×2 IMPLANT
BAG URO DRAIN 4000ML (MISCELLANEOUS) ×2 IMPLANT
BAND RUBBER #18 3X1/16 STRL (MISCELLANEOUS) ×6 IMPLANT
CATH FOLEY 3WAY 30CC 22FR (CATHETERS) IMPLANT
CATH FOLEY 3WAY 30CC 24FR (CATHETERS)
CATH URETL 5X70 OPEN END (CATHETERS) IMPLANT
CATH URTH STD 24FR FL 3W 2 (CATHETERS) IMPLANT
CONTAINER COLLECT MORCELLATR (MISCELLANEOUS) ×1 IMPLANT
DRAPE UTILITY 15X26 TOWEL STRL (DRAPES) IMPLANT
ELECT BIVAP BIPO 22/24 DONUT (ELECTROSURGICAL)
ELECTRD BIVAP BIPO 22/24 DONUT (ELECTROSURGICAL) IMPLANT
FIBER LASER MOSES 550 DFL (Laser) ×2 IMPLANT
FILTER OVERFLOW MORCELLATOR (FILTER) ×1 IMPLANT
GLOVE SURG ENC TEXT LTX SZ7 (GLOVE) ×2 IMPLANT
GOWN STRL REUS W/TWL LRG LVL3 (GOWN DISPOSABLE) ×2 IMPLANT
HOLDER FOLEY CATH W/STRAP (MISCELLANEOUS) ×2 IMPLANT
KIT TURNOVER KIT A (KITS) ×2 IMPLANT
LOOP CUT BIPOLAR 24F LRG (ELECTROSURGICAL) ×2 IMPLANT
MBRN O SEALING YLW 17 FOR INST (MISCELLANEOUS) ×2
MEMBRANE SLNG YLW 17 FOR INST (MISCELLANEOUS) ×1 IMPLANT
MORCELLATOR COLLECT CONTAINER (MISCELLANEOUS) ×2
MORCELLATOR OVERFLOW FILTER (FILTER) ×2
MORCELLATOR ROTATION 4.75 335 (MISCELLANEOUS) ×2 IMPLANT
PACK CYSTO (CUSTOM PROCEDURE TRAY) ×2 IMPLANT
SET IRRIG Y TYPE TUR BLADDER L (SET/KITS/TRAYS/PACK) ×2 IMPLANT
SLEEVE SURGEON STRL (DRAPES) ×4 IMPLANT
SYR 30ML LL (SYRINGE) ×2 IMPLANT
SYR TOOMEY IRRIG 70ML (MISCELLANEOUS) ×2
SYRINGE TOOMEY IRRIG 70ML (MISCELLANEOUS) ×1 IMPLANT
TUBE PUMP MORCELLATOR PIRANHA (TUBING) ×2 IMPLANT
TUBING UROLOGY SET (TUBING) ×2 IMPLANT
WATER STERILE IRR 1000ML POUR (IV SOLUTION) ×2 IMPLANT

## 2020-06-18 NOTE — Op Note (Signed)
Operative Note  Preoperative diagnosis:  1.  Bladder outlet obstruction. 2.  Benign prostatic hyperplasia. 3.  Urinary retention  Postoperative diagnosis: 1.  Bladder outlet obstruction. 2.  Benign prostatic hyperplasia. 3.  Urinary retention  Procedure(s): 1.  Cystoscopy 2.  Holmium laser enucleation of the prostate with tissue morcellation  Surgeon: Rexene Alberts, MD  Assistants:  None  Anesthesia:  General  Complications:  None  EBL:  Minimal  Specimens: 1. Prostate chips ID Type Source Tests Collected by Time Destination  1 : prostate chips GU Prostate Chips SURGICAL PATHOLOGY Janith Lima, MD 06/18/2020 1230     Drains/Catheters: 1.  22-French 3-way Foley catheter  Intraoperative findings:   1.  No bladder lesions. 2.  Ureteral orifices in orthotopic position. 3.  Plus +2 trabeculation.  Indication:  Travarus Trudo. is a 70 y.o. male with a history of bladder outlet obstruction and benign prostatic hyperplasia.  He was found to be in retention since 02/10/2020.  He had a Foley catheter in place since that time.  Urodynamics 03/10/2020 revealed strong voluntary contraction with a PVR of 362 mL. Risks, benefits and alternatives were explained and the patient decided to proceed.  Description of procedure: The indications, alternatives, benefits and risks were discussed with the patient and informed consent was obtained.  The patient was brought onto the operating room table, positioned supine and secured with a safety strap.  Pneumatic compression devices were placed on the lower extremities.  After administration of intravenous antibiotics and general anesthesia, the patient was repositioned in the dorsal lithotomy position and all pressure points were carefully padded.  The genitalia were prepped and draped in the standard sterile manner.  Timeout was completed, verifying the correct patient, surgical procedure, and positioning prior to beginning the procedure.  Isotonic  normal saline was used for irrigation.  The patient's urethra was calibrated to 30 [place] with sequential [employer] sounds.  Next, a 60 Frenchcontinuous-flow resectoscope was inserted into the patient's bladder using the visual obturator.  This was then exchanged for the laser bridge.  On cystoscopic evaluation, there were no tumors, stones or foreign bodies or diverticula present.  The bladder wall appeared trabeculated.  Both orifices were in the normal anatomic position with clear urinary reflux noted bilaterally.  The location of the ureteral orifices and prostatic configuration was again confirmed.  Using the 550 m holmium Moses laser fiber, we first made an incision starting above the verumontanum taking this down to the capsule.  We then made an incision starting at the bladder neck at 5 and 7 o'clock, taking these down to the level of the verumontanum.  We then enucleated the median lobe in a retrograde fashion.  At this point, there is some bleeding in the prostatic fossa.  I exchanged for a bipolar loop and fulgurated the bleeders.  Given concern for further bleeding, and that the majority of obstructing prostate was his intravesical component, I proceeded with a bipolar transurethral resection of the prostate with his lateral lobes.   The location of the ureteral orifices and the prostatic configuration were again confirmed. Starting at the bladder neck and proceeding distally to the verumontanum a transurethral section of the prostate was performed using bipolar using energy of 4 and 5 for cutting and coagulation, respectively. Next the left lateral lobe was resected so that a wide open prostatic channel was created.  Next, the right lateral lobe was resected so that a wide open prostatic channel was created.  All bleeding vessels were fulgurated achieving meticulous hemostasis.  The bladder was irrigated with a Toomey syringe, ensuring removal of all prostate chips which were sent to pathology  for evaluation.  We then exchanged the continuous flow resectoscope for the offset nephroscope with the tissue morcellator and morcellated the median lobe.  We then reinserted the continuous flow resectoscope and used the bipolar loop to confirm hemostasis. Once this was done, the scope was removed and a 22-French 3-way Foley catheter was placed, 50 mL sterile water in the balloon.   Plan: Continuous bladder irrigation overnight with gentle Foley traction. Remove traction in the morning and plan to discharge home tomorrow with Foley catheter in place and void trial in the office in 3 days.  Matt R. [facility]  Pager: [phone]

## 2020-06-18 NOTE — Brief Op Note (Addendum)
Patient has history of hypertension. BP level ranges between 139-151/99-104.  1817: Anesthesiologist informed. 1823: Labetalol 10mg  IV administered as per anesthesiologist.  1845: BP Now 127/82.  For transfer to 4W

## 2020-06-18 NOTE — Progress Notes (Addendum)
PHARMACIST - PHYSICIAN ORDER COMMUNICATION  CONCERNING: P&T Medication Policy on Herbal Medications  DESCRIPTION:  This patient's order for:  Glucosamine-chondroitin  has been noted.  This product(s) is classified as an "herbal" or natural product. Due to a lack of definitive safety studies or FDA approval, nonstandard manufacturing practices, plus the potential risk of unknown drug-drug interactions while on inpatient medications, the Pharmacy and Therapeutics Committee does not permit the use of "herbal" or natural products of this type within [facility].   ACTION TAKEN: The pharmacy department is unable to verify this order at this time and your patient has been informed of this safety policy. Please reevaluate patient's clinical condition at discharge and address if the herbal or natural product(s) should be resumed at that time.  Dia Sitter, PharmD, BCPS 06/18/2020 8:00 PM

## 2020-06-18 NOTE — Anesthesia Preprocedure Evaluation (Signed)
Anesthesia Evaluation  Patient identified by MRN, date of birth, ID band Patient awake    Reviewed: Allergy & Precautions, H&P , NPO status , Patient's Chart, lab work & pertinent test results  Airway Mallampati: II   Neck ROM: full    Dental   Pulmonary former smoker,    breath sounds clear to auscultation       Cardiovascular hypertension,  Rhythm:regular Rate:Normal     Neuro/Psych    GI/Hepatic PUD, GERD  ,  Endo/Other  Hypothyroidism   Renal/GU    BPH    Musculoskeletal  (+) Arthritis ,   Abdominal   Peds  Hematology   Anesthesia Other Findings   Reproductive/Obstetrics                             Anesthesia Physical Anesthesia Plan  ASA: II  Anesthesia Plan: General   Post-op Pain Management:    Induction: Intravenous  PONV Risk Score and Plan: 2 and Ondansetron, Dexamethasone, Midazolam and Treatment may vary due to age or medical condition  Airway Management Planned: Oral ETT  Additional Equipment:   Intra-op Plan:   Post-operative Plan: Extubation in OR  Informed Consent: I have reviewed the patients History and Physical, chart, labs and discussed the procedure including the risks, benefits and alternatives for the proposed anesthesia with the patient or authorized representative who has indicated his/her understanding and acceptance.     Dental advisory given  Plan Discussed with: CRNA, Anesthesiologist and Surgeon  Anesthesia Plan Comments:         Anesthesia Quick Evaluation

## 2020-06-18 NOTE — H&P (Signed)
Benjamin Chambers  MRN: [id number]  DOB: Oct 16, 1950, 70 year old Male  SSN:    PRIMARY CARE:  Dimas Chyle, MD  REFERRING:  Dimas Chyle, MD  PROVIDER:  Louis Meckel, M.D.  TREATING:  Rexene Alberts, M.D.  LOCATION:  [facility] [phone]     --------------------------------------------------------------------------------   CC/HPI: Benjamin Chambers is a 70 year old male seen in followup today with urinary retention, BPH/LUTS.   He was evaluated by his PCP Dimas Chyle and found to have an elevated creatinine of 1.38 on 12/02/2019. He underwent a renal ultrasound on 02/10/2020 which revealed severe left hydronephrosis and marked bladder distention with a large postvoid residual greater than 1 L along with bladder wall trabeculation suggesting chronic bladder outlet obstruction.   Patient's PSA on 12/02/2019 was normal at 1.4 NG/mL. He denies a family history of prostate cancer.   He does complain of approximately a year history of bothersome lower urinary tract symptoms including sensation of incomplete bladder emptying, frequency, intermittency, urgency, weak flow of stream, 3 time nocturia. IPSS score is 21, Q OL 6. He was placed on 0.4 mg of Flomax by his PCP in 12/2019.   UDS 03/10/2020:  -Max capacity 375 mL  -Able to generate strong voluntary contraction (77) however unable to void. Increased EMG activity during voiding. PVR 362 mL. Foley was replaced.   BPH evaluation 04/16/2019:  TRUS 144ml  Cysto: Trilobar obstructing prostate with a very large intravesical component. Elongated prostatic urethra. No evidence of bladder malignancy. Edema and inflammation on the posterior and dome of the bladder consistent with chronic indwelling Foley catheter placement.  Unable to void. PVR 400 mL   He has been tolerating his foley well but ready for it to be removed.   Patient currently denies fever, chills, sweats, nausea, vomiting, abdominal or flank pain, gross hematuria or dysuria.      ALLERGIES: Asacol - Hives penicillin - Skin Rash Strawberries - Hives    MEDICATIONS: Lisinopril 10 mg tablet  Omeprazole 20 mg capsule,delayed release  Tamsulosin Hcl 0.4 mg capsule  Zyrtec 10 mg capsule  Bishop's Cap  Carisoprodol 350 mg tablet  Cholest Off  Co Q10  Combigan  Esomeprazole Magnesium 40 mg capsule,delayed release  Garlic 3,557 mg capsule  Glucosamine  Latanoprost 0.005 % drops  Levothyroxine 25 mcg capsule  Lialda 1.2 gram tablet, delayed release  Niacin 500 mg capsule  Omega 3  Omega-3 Krill Oil  Red Yeast Rice  Timolol Maleate 0.5 % dropperette, single-use drop dispenser  Turmeric 500 mg capsule  Zeaxanthin     GU PSH: Complex cystometrogram, w/ void pressure and urethral pressure profile studies, any technique - 03/10/2020 Complex Uroflow - 03/10/2020 Emg surf Electrd - 03/10/2020 Inject For cystogram - 03/10/2020 Intrabd voidng Press - 03/10/2020     NON-GU PSH: Hernia Repair, Left     GU PMH: Nocturia - 04/08/2020, - 03/22/2020, - 02/11/2020 Urinary Frequency - 04/08/2020, - 03/22/2020, - 02/11/2020 Urinary Retention - 04/08/2020, - 03/31/2020, - 03/18/2020, - 03/10/2020, - 02/11/2020 BPH w/LUTS - 03/31/2020, - 03/18/2020, - 02/11/2020 Other difficulties with micturition - 03/31/2020, - 03/18/2020 Encounter for Prostate Cancer screening (Stable) - 03/18/2020, - 02/11/2020 Hydronephrosis - 03/18/2020, - 02/24/2020, - 02/11/2020 Other mechanical complication of indwelling urethral catheter, initial encounter - 03/01/2020    NON-GU PMH: Muscle weakness (generalized) - 04/08/2020, - 03/22/2020 Other muscle spasm - 04/08/2020, - 03/22/2020 Other specified disorders of muscle - 04/08/2020, - 03/22/2020 Arthritis GERD Glaucoma Hypercholesterolemia Hypertension Hypothyroidism Skin Cancer, History  FAMILY HISTORY: 2 sons - Runs in Family Diabetes - Father, Mother Hypertension - Sister, Mother Hypothyroidism - Runs in Family ulcerative colitis -  Aunt   SOCIAL HISTORY: Marital Status: Married Preferred Language: English; Ethnicity: Not Hispanic Or Latino; Race: White Current Smoking Status: Patient does not smoke anymore.   Tobacco Use Assessment Completed: Used Tobacco in last 30 days? Has never drank.  Drinks 1 caffeinated drink per day.    REVIEW OF SYSTEMS:    GU Review Male:   Patient denies trouble starting your stream, get up at night to urinate, hard to postpone urination, frequent urination, have to strain to urinate , penile pain, stream starts and stops, burning/ pain with urination, erection problems, and leakage of urine.  Gastrointestinal (Upper):   Patient denies nausea, vomiting, and indigestion/ heartburn.  Gastrointestinal (Lower):   Patient denies diarrhea and constipation.  Constitutional:   Patient denies fever, night sweats, weight loss, and fatigue.  Skin:   Patient denies skin rash/ lesion and itching.  Eyes:   Patient denies blurred vision and double vision.  Ears/ Nose/ Throat:   Patient denies sore throat and sinus problems.  Hematologic/Lymphatic:   Patient denies swollen glands and easy bruising.  Cardiovascular:   Patient denies leg swelling and chest pains.  Respiratory:   Patient denies cough and shortness of breath.  Endocrine:   Patient denies excessive thirst.  Musculoskeletal:   Patient denies back pain and joint pain.  Neurological:   Patient denies headaches and dizziness.  Psychologic:   Patient denies depression and anxiety.   VITAL SIGNS:      04/15/2020 11:08 AM  Weight 178 lb / 80.74 kg  Height 72 in / 182.88 cm  BP 148/92 mmHg  Pulse 96 /min  Temperature 98.4 F / 36.8 C  BMI 24.1 kg/m   MULTI-SYSTEM PHYSICAL EXAMINATION:    Constitutional: Well-nourished. No physical deformities. Normally developed. Good grooming.  Respiratory: No labored breathing, no use of accessory muscles.   Cardiovascular: Normal temperature, normal extremity pulses, no swelling, no varicosities.   Gastrointestinal: No mass, no tenderness, no rigidity, non obese abdomen.     Complexity of Data:  Source Of History:  Patient, Healthcare Provider, Medical Record Summary  Lab Test Review:   PSA  Records Review:   AUA Symptom Score, Previous Doctor Records, Previous Patient Records, IIEF Score  Urine Test Review:   Urinalysis  Urodynamics Review:   Review Bladder Scan  X-Ray Review: Prostate Ultrasound: Reviewed Films.     PROCEDURES:         Flexible Cystoscopy - 52000  Risks, benefits, and some of the potential complications of the procedure were discussed at length with the patient including infection, bleeding, voiding discomfort, urinary retention, fever, chills, sepsis, and others. All questions were answered. Informed consent was obtained. Antibiotic prophylaxis was given. Sterile technique and intraurethral analgesia were used.  Meatus:  Normal size. Normal location. Normal condition.  Urethra:  No strictures.  External Sphincter:  Normal.  Verumontanum:  Normal.  Prostate:  Trilobar obstruction with large intravesical component. Kissing lateral lobes.  Bladder Neck:  Obstructing  Ureteral Orifices:  Normal location. Normal size. Normal shape. Effluxed clear urine.  Bladder:  4+ trabeculation. Edema and inflammation on posterior and dome of bladder consistent with chronic foley       The lower urinary tract was carefully examined. The procedure was well-tolerated and without complications. Antibiotic instructions were given. Instructions were given to call the office immediately for bloody urine, difficulty  urinating, urinary retention, painful or frequent urination, fever, chills, nausea, vomiting or other illness. The patient stated that he understood these instructions and would comply with them.          Prostate Ultrasound - 42683  Length: 6.75 CM Height: 5.48 CM Width:5.63 CM Volume: 109.01 ML      The transrectal ultrasound probe is introduced into the rectum,  and the prostate is visualized. Ultrasonography is utilized throughout the procedure. At the conclusion of the procedure, the ultrasound probe is removed. The patient tolerates the procedure without complication. Patient confirmed No Neulasta OnPro Device.     ASSESSMENT:      ICD-10 Details  1 GU:   BPH w/LUTS - N40.1   2   Encounter for Prostate Cancer screening - Z12.5   3   Hydronephrosis - N13.0   4   Urinary Frequency - R35.0   5   Urinary Retention - R33.8   6   Other mechanical complication of indwelling urethral catheter, initial encounter - T83.091A   7   Other difficulties with micturition - R39.198    PLAN:           Schedule Return Visit/Planned Activity: 1 Month - Nurse Visit             Note: Foley exchange 18 Fr coude          Document Letter(s):  Created for Patient: Clinical Summary         Notes:   #1. Urinary retention: Foley initially placed 02/11/2020 with return of 1.2 L clear yellow urine. UDS as above with voiding dysfunction. BPH evaluation above with 109 g prostate and trilobar obstructing hypertrophy. Follow-up in 1 month for Foley catheter exchange.   2. BPH with LUTS: BPH evaluation today, 04/15/2020 with evidence of 109 g prostate, trilobar obstructing prostate with large intravesical component. Discussed surgical options. Reccomend HoLEP given prostate size. We did discuss that even with this, he may have some urinary retention given elements of voiding dysfunction on UDS. We discussed possible need for staged procedure. Surgery letter sent today. Discussed that may be delayed scheduling given current COVID pandemic.   Risks and benefits of Holmium Laser Enucleation of the Prostate were reviewed in detail including infection, bleeding, blood transfusion, injury to bladder/urethra/surrounding structures, erectile dysfunction, urinary incontinence, bladder neck contracture, persistent obstructive and irritative voiding symptoms, and global anesthesia risks  including but not limited to CVA, MI, DVT, PE, pneumonia, and death. He expressed understanding and desire to proceed.    #3. Left hydronephrosis: Renal bladder ultrasound 02/10/2020 with severe left hydronephrosis. Creatinine on 12/02/2019 elevated to 1.38. Creatinine on 02/18/2020 was normal at 1.0. Renal ultrasound 02/24/2020 with resolved hydronephrosis.   #4. Voiding dysfunction: UDS 03/10/2020 with strong voluntary contraction (77) however unable to void. Increased EMG activity during voiding. PVR 362 mL. Continue Foley catheter for now. Change 1 month.   5. Prostate cancer screening: DRE at least 80 grams, no nodules. PSA 12/02/2019 was normal at 1.4 NG/mL.   CC: Dimas Chyle, MD   Urology Preoperative H&P   Chief Complaint: BPH, urinary retention  History of Present Illness: Benjamin Chambers. is a 70 y.o. male with BPH, urinary retention, here for HoLEP.    Past Medical History:  Diagnosis Date  . Allergy   . Anemia   . Cancer (HCC)    basal and squamus cells  . GERD (gastroesophageal reflux disease)   . Hypertension   . Hypothyroidism   .  Osteoarthritis   . Ulcerative colitis [facility])     Past Surgical History:  Procedure Laterality Date  . BASAL CELL CARCINOMA EXCISION  2011  . HERNIA REPAIR  2018  . SQUAMOUS CELL CARCINOMA EXCISION  2015    Allergies:  Allergies  Allergen Reactions  . Penicillins     Family history only  . Strawberry Extract Hives and Itching  . Codeine Nausea Only    History reviewed. No pertinent family history.  Social History:  reports that he has quit smoking. He has never used smokeless tobacco. He reports current alcohol use. He reports that he does not use drugs.  ROS: A complete review of systems was performed.  All systems are negative except for pertinent findings as noted.  Physical Exam:  Vital signs in last 24 hours: Temp:  [98.7 F (37.1 C)] 98.7 F (37.1 C) (03/18 0956) Pulse Rate:  [71] 71 (03/18 0956) Resp:  [16] 16  (03/18 0956) BP: (156)/(97) 156/97 (03/18 0956) SpO2:  [100 %] 100 % (03/18 0956) Constitutional:  Alert and oriented, No acute distress Cardiovascular: Regular rate and rhythm Respiratory: Normal respiratory effort, Lungs clear bilaterally GI: Abdomen is soft, nontender, nondistended, no abdominal masses GU: No CVA tenderness Lymphatic: No lymphadenopathy Neurologic: Grossly intact, no focal deficits Psychiatric: Normal mood and affect  Laboratory Data:  No results for input(s): WBC, HGB, HCT, PLT in the last 72 hours.  No results for input(s): NA, K, CL, GLUCOSE, BUN, CALCIUM, CREATININE in the last 72 hours.  Invalid input(s): CO3   No results found for this or any previous visit (from the past 24 hour(s)). Recent Results (from the past 240 hour(s))  SARS CORONAVIRUS 2 (TAT 6-24 HRS) Nasopharyngeal Nasopharyngeal Swab     Status: None   Collection Time: 06/15/20  8:31 AM   Specimen: Nasopharyngeal Swab  Result Value Ref Range Status   SARS Coronavirus 2 NEGATIVE NEGATIVE Final    Comment: (NOTE) SARS-CoV-2 target nucleic acids are NOT DETECTED.  The SARS-CoV-2 RNA is generally detectable in upper and lower respiratory specimens during the acute phase of infection. Negative results do not preclude SARS-CoV-2 infection, do not rule out co-infections with other pathogens, and should not be used as the sole basis for treatment or other patient management decisions. Negative results must be combined with clinical observations, patient history, and epidemiological information. The expected result is Negative.  Fact Sheet for Patients: SugarRoll.be  Fact Sheet for Healthcare Providers: https://www.woods-mathews.com/  This test is not yet approved or cleared by the [place] FDA and  has been authorized for detection and/or diagnosis of SARS-CoV-2 by FDA under an Emergency Use Authorization (EUA). This EUA will remain  in effect  (meaning this test can be used) for the duration of the COVID-19 declaration under Se ction 564(b)(1) of the Act, 21 U.S.C. section 360bbb-3(b)(1), unless the authorization is terminated or revoked sooner.  Performed at [facility], [place] [address] [place],  [address]     Renal Function: No results for input(s): CREATININE in the last 168 hours. Estimated Creatinine Clearance: 57.5 mL/min (A) (by C-G formula based on SCr of 1.33 mg/dL (H)).  Radiologic Imaging: No results found.  I independently reviewed the above imaging studies.  Assessment and Plan Candace Ramus. is a 70 y.o. male with BPH, urinary retention, here for HoLEP.   Risks and benefits of Holmium Laser Enucleation of the Prostate were reviewed in detail including infection, bleeding, blood transfusion, injury to bladder/urethra/surrounding structures,  erectile dysfunction, urinary incontinence, bladder neck contracture, persistent obstructive and irritative voiding symptoms, and global anesthesia risks including but not limited to CVA, MI, DVT, PE, pneumonia, and death. He expressed understanding and desire to proceed.     Matt R. Frederick Marro MD 06/18/2020, 11:19 AM  [facility] Pager: [phone]): [phone]

## 2020-06-18 NOTE — Anesthesia Procedure Notes (Signed)
Procedure Name: Intubation Date/Time: 06/18/2020 1:01 PM Performed by: Lollie Sails, CRNA Pre-anesthesia Checklist: Patient identified, Emergency Drugs available, Suction available, Patient being monitored and Timeout performed Patient Re-evaluated:Patient Re-evaluated prior to induction Oxygen Delivery Method: Circle system utilized Preoxygenation: Pre-oxygenation with 100% oxygen Induction Type: IV induction Ventilation: Mask ventilation without difficulty Laryngoscope Size: Glidescope and 4 (Miller 3 first attempt;  ) Grade View: Grade I Tube type: Oral Tube size: 7.5 mm Number of attempts: 2 Airway Equipment and Method: Stylet Placement Confirmation: ETT inserted through vocal cords under direct vision,  positive ETCO2 and breath sounds checked- equal and bilateral Secured at: 23 cm Tube secured with: Tape Dental Injury: Teeth and Oropharynx as per pre-operative assessment  Difficulty Due To: Difficult Airway- due to anterior larynx Comments: First attempt with Miller 3 = grade 3 view.   Switched to Glidescope with full view of cords - endotube passed atraumatically.

## 2020-06-18 NOTE — Progress Notes (Signed)
Day of Surgery Subjective: Pain controlled. No nausea or emesis.  Objective: Vital signs in last 24 hours: Temp:  [98.4 F (36.9 C)-98.7 F (37.1 C)] 98.4 F (36.9 C) (03/18 1542) Pulse Rate:  [71-91] 91 (03/18 1615) Resp:  [11-19] 19 (03/18 1615) BP: (123-156)/(91-97) 140/95 (03/18 1615) SpO2:  [100 %] 100 % (03/18 1615)  Intake/Output from previous day: No intake/output data recorded. Intake/Output this shift: Total I/O In: 1715 [I.V.:1615; IV Piggyback:100] Out: 200 [Blood:200]  Physical Exam:  General: Alert and oriented CV: RRR Lungs: Clear Abdomen: Soft, ND, NT Ext: NT, No erythema Foley slight pink tinge on CBI  Lab Results: No results for input(s): HGB, HCT in the last 72 hours. BMET No results for input(s): NA, K, CL, CO2, GLUCOSE, BUN, CREATININE, CALCIUM in the last 72 hours.   Studies/Results: No results found.  Assessment/Plan: 1. BPH s/p HoLEP 06/18/2020  -Pain control prn -Diet as tolerated -CBI overnight. Foley to gentle rubber band traction overnight. Plan to take off traction tomorrow, clamp CBI. -Anticipate discharge home tomorrow with foley in place and VT in office on Monday   LOS: 0 days   Matt R. Kynlea Blackston MD 06/18/2020, [facility]  Pager: [phone]

## 2020-06-18 NOTE — Transfer of Care (Signed)
Immediate Anesthesia Transfer of Care Note  Patient: Benjamin Chambers.  Procedure(s) Performed: Holmium Laser Enucleation of the Prostate with Morcellation/Turp (N/A )  Patient Location: PACU  Anesthesia Type:General  Level of Consciousness: awake, alert  and oriented  Airway & Oxygen Therapy: Patient Spontanous Breathing and Patient connected to face mask oxygen  Post-op Assessment: Report given to RN and Post -op Vital signs reviewed and stable  Post vital signs: Reviewed and stable  Last Vitals:  Vitals Value Taken Time  BP    Temp    Pulse    Resp    SpO2      Last Pain:  Vitals:   06/18/20 1000  TempSrc:   PainSc: 0-No pain      Patients Stated Pain Goal: 3 (28/76/81 1572)  Complications: No complications documented.

## 2020-06-18 NOTE — Discharge Instructions (Signed)

## 2020-06-19 ENCOUNTER — Other Ambulatory Visit: Payer: Self-pay

## 2020-06-19 ENCOUNTER — Encounter: Payer: Self-pay | Admitting: Family Medicine

## 2020-06-19 DIAGNOSIS — N401 Enlarged prostate with lower urinary tract symptoms: Secondary | ICD-10-CM | POA: Diagnosis not present

## 2020-06-19 LAB — BASIC METABOLIC PANEL
Anion gap: 8 (ref 5–15)
BUN: 15 mg/dL (ref 8–23)
CO2: 24 mmol/L (ref 22–32)
Calcium: 8.9 mg/dL (ref 8.9–10.3)
Chloride: 106 mmol/L (ref 98–111)
Creatinine, Ser: 1.23 mg/dL (ref 0.61–1.24)
GFR, Estimated: >60 mL/min (ref >60)
Glucose, Bld: 134 mg/dL — ABNORMAL HIGH (ref 70–99)
Potassium: 4.1 mmol/L (ref 3.5–5.1)
Sodium: 138 mmol/L (ref 135–145)

## 2020-06-19 LAB — CBC
HCT: 38.6 % — ABNORMAL LOW (ref 39.0–52.0)
Hemoglobin: 12.8 g/dL — ABNORMAL LOW (ref 13.0–17.0)
MCH: 28.7 pg (ref 26.0–34.0)
MCHC: 33.2 g/dL (ref 30.0–36.0)
MCV: 86.5 fL (ref 80.0–100.0)
Platelets: 219 10*3/uL (ref 150–400)
RBC: 4.46 MIL/uL (ref 4.22–5.81)
RDW: 14.2 % (ref 11.5–15.5)
WBC: 11.7 10*3/uL — ABNORMAL HIGH (ref 4.0–10.5)
nRBC: 0 % (ref 0.0–0.2)

## 2020-06-19 NOTE — Plan of Care (Signed)

## 2020-06-19 NOTE — Progress Notes (Signed)
Pt discharged home today per Dr. Jeffie Pollock. Pt's IV site D/C'd and WDL. Pt's VSS. Pt provided with home medication list, discharge instructions and prescriptions. Verbalized understanding. Pt left floor via WC in stable condition accompanied by RN.

## 2020-06-19 NOTE — Discharge Summary (Signed)
Physician Discharge Summary  Patient ID: Benjamin Chambers. MRN: [id number] DOB/AGE: [birth year] 70 y.o.  Admit date: 06/18/2020 Discharge date: 06/19/2020  Admission Diagnoses:  BPH with obstruction/lower urinary tract symptoms  Discharge Diagnoses:  Principal Problem:   BPH with obstruction/lower urinary tract symptoms   Past Medical History:  Diagnosis Date  . Allergy   . Anemia   . Cancer (HCC)    basal and squamus cells  . GERD (gastroesophageal reflux disease)   . Hypertension   . Hypothyroidism   . Osteoarthritis   . Ulcerative colitis ([place])     Surgeries: Procedure(s): Holmium Laser Enucleation of the Prostate with Morcellation/Turp on 06/18/2020   Consultants (if any):   Discharged Condition: Improved  Hospital Course: Benjamin Gill. is an 70 y.o. male who was admitted 06/18/2020 with a diagnosis of BPH with obstruction/lower urinary tract symptoms and went to the operating room on 06/18/2020 and underwent the above named procedures.  His urine is clear this AM.  He will have the CBI port plugged and discharged home with a leg bag.  He was given perioperative antibiotics:  Anti-infectives (From admission, onward)   Start     Dose/Rate Route Frequency Ordered Stop   06/18/20 1000  ceFAZolin (ANCEF) IVPB 2g/100 mL premix        2 g 200 mL/hr over 30 Minutes Intravenous  Once 06/18/20 0950 06/18/20 1317   06/18/20 0952  ceFAZolin (ANCEF) 2-4 GM/100ML-% IVPB       Note to Pharmacy: Karsten Ro   : cabinet override      06/18/20 0952 06/18/20 1309    .  He was given sequential compression devices  for DVT prophylaxis.  He benefited maximally from the hospital stay and there were no complications.    Recent vital signs:  Vitals:   06/19/20 0407 06/19/20 0737  BP: 115/82 (!) 130/92  Pulse: 70 63  Resp: 19 15  Temp: 97.7 F (36.5 C) 98.4 F (36.9 C)  SpO2: 99% 100%    Recent laboratory studies:  Lab Results  Component Value Date   HGB 12.8 (L)  06/19/2020   HGB 14.8 06/18/2020   HGB 15.0 06/08/2020   Lab Results  Component Value Date   WBC 11.7 (H) 06/19/2020   PLT 219 06/19/2020   No results found for: INR Lab Results  Component Value Date   NA 138 06/19/2020   K 4.1 06/19/2020   CL 106 06/19/2020   CO2 24 06/19/2020   BUN 15 06/19/2020   CREATININE 1.23 06/19/2020   GLUCOSE 134 (H) 06/19/2020    Discharge Medications:   Allergies as of 06/19/2020      Reactions   Penicillins    Family history only   Strawberry Extract Hives, Itching   Codeine Nausea Only      Medication List    STOP taking these medications   baclofen 20 MG tablet Commonly known as: LIORESAL   cetirizine 10 MG tablet Commonly known as: ZYRTEC   esomeprazole 40 MG capsule Commonly known as: NEXIUM   Ginseng 100 MG Caps   timolol 0.5 % ophthalmic gel-forming Commonly known as: TIMOPTIC-XR     TAKE these medications   brimonidine-timolol 0.2-0.5 % ophthalmic solution Commonly known as: COMBIGAN Place 1 drop into both eyes every 12 (twelve) hours.   carisoprodol 350 MG tablet Commonly known as: SOMA Take 175 mg by mouth daily as needed for muscle spasms.   docusate sodium 100 MG capsule Commonly known as:  Colace Take 1 capsule (100 mg total) by mouth daily as needed.   Garlic 5631 MG Caps Take 1,000 mg by mouth daily.   glucosamine-chondroitin 500-400 MG tablet Take 1 tablet by mouth daily.   KRILL OIL PO Take 520 mg by mouth daily.   latanoprost 0.005 % ophthalmic solution Commonly known as: XALATAN Place 1 drop into both eyes at bedtime.   levothyroxine 25 MCG tablet Commonly known as: SYNTHROID TAKE 1 TABLET BY MOUTH EVERY DAY BEFORE BREAKFAST   lisinopril 10 MG tablet Commonly known as: ZESTRIL TAKE 1 TABLET BY MOUTH EVERY DAY   mesalamine 1.2 g EC tablet Commonly known as: LIALDA Take 2.4 g by mouth daily with breakfast.   niacin 500 MG tablet Take 500 mg by mouth 3 (three) times a week.    ondansetron 4 MG tablet Commonly known as: Zofran Take 1 tablet (4 mg total) by mouth every 8 (eight) hours as needed for up to 18 doses for nausea or vomiting.   oxyCODONE-acetaminophen 5-325 MG tablet Commonly known as: Percocet Take 1 tablet by mouth every 4 (four) hours as needed for up to 18 doses for severe pain.   Plant Sterols and Stanols 450 MG Tabs Take 900 mg by mouth in the morning and at bedtime.   PRESERVISION AREDS PO Take 1 tablet by mouth daily.   Spectravite Tabs Take 1 tablet by mouth daily.   RED YEAST RICE PO Take 1,200 mg by mouth daily.   tamsulosin 0.4 MG Caps capsule Commonly known as: FLOMAX TAKE 1 CAPSULE BY MOUTH EVERY DAY   Turmeric 500 MG Caps Take 500 mg by mouth 2 (two) times a week.       Diagnostic Studies: No results found.  Disposition: Discharge disposition: 01-Home or Self Care       Discharge Instructions    Discontinue IV   Complete by: As directed        Follow-up Information    [facility] On 06/21/2020.   Why: 8:15AM Contact information: [place] [phone]               Signed: Irine Seal 06/19/2020, 9:59 AM

## 2020-06-19 NOTE — Progress Notes (Signed)
Patient remain stable over night S/P Cystoscopy; did not require anything for pain, tolerated sandwich and PO meds well. CBI is clear pink will be clamped at 6am per orders no distress noted will continue present plan of care.

## 2020-06-20 NOTE — Anesthesia Postprocedure Evaluation (Signed)
Anesthesia Post Note  Patient: Benjamin Chambers.  Procedure(s) Performed: Holmium Laser Enucleation of the Prostate with Morcellation/Turp (N/A )     Patient location during evaluation: PACU Anesthesia Type: General Level of consciousness: awake and alert Pain management: pain level controlled Vital Signs Assessment: post-procedure vital signs reviewed and stable Respiratory status: spontaneous breathing, nonlabored ventilation, respiratory function stable and patient connected to nasal cannula oxygen Cardiovascular status: blood pressure returned to baseline and stable Postop Assessment: no apparent nausea or vomiting Anesthetic complications: no   No complications documented.  Last Vitals:  Vitals:   06/19/20 0407 06/19/20 0737  BP: 115/82 (!) 130/92  Pulse: 70 63  Resp: 19 15  Temp: 36.5 C 36.9 C  SpO2: 99% 100%    Last Pain:  Vitals:   06/19/20 0808  TempSrc:   PainSc: 0-No pain                 Jadene Stemmer S

## 2020-06-21 NOTE — Telephone Encounter (Signed)
Please advise 

## 2020-06-22 LAB — SURGICAL PATHOLOGY

## 2020-07-09 ENCOUNTER — Other Ambulatory Visit: Payer: Self-pay | Admitting: Family Medicine

## 2020-07-30 ENCOUNTER — Other Ambulatory Visit: Payer: Self-pay

## 2020-07-30 ENCOUNTER — Other Ambulatory Visit (HOSPITAL_BASED_OUTPATIENT_CLINIC_OR_DEPARTMENT_OTHER): Payer: Self-pay

## 2020-07-30 ENCOUNTER — Ambulatory Visit: Payer: BC Managed Care – PPO | Attending: Internal Medicine

## 2020-07-30 DIAGNOSIS — Z23 Encounter for immunization: Secondary | ICD-10-CM

## 2020-07-30 MED ORDER — PFIZER-BIONT COVID-19 VAC-TRIS 30 MCG/0.3ML IM SUSP
INTRAMUSCULAR | 0 refills | Status: DC
  Filled 2020-07-30: qty 0.3, 1d supply, fill #0

## 2020-07-30 NOTE — Progress Notes (Signed)
   [id number]  Name:  [name].    MRN: [id number] DOB: 12-12-1950  07/30/2020  [name] was observed post Covid-19 immunization for 15 minutes without incident. He was provided with Vaccine Information Sheet and instruction to access the V-Safe system.   [name] was instructed to call 911 with any severe reactions post vaccine: [name] Difficulty breathing  . Swelling of face and throat  . A fast heartbeat  . A bad rash all over body  . Dizziness and weakness   Immunizations Administered    Name Date Dose VIS Date Route   PFIZER Comrnaty(Gray TOP) Covid-19 Vaccine 07/30/2020  3:44 PM 0.3 mL 03/11/2020 Intramuscular   Manufacturer: [place]   Lot: [id number]   NDC: [phone]

## 2020-08-24 ENCOUNTER — Other Ambulatory Visit: Payer: Self-pay | Admitting: Family Medicine

## 2020-09-14 ENCOUNTER — Other Ambulatory Visit (HOSPITAL_BASED_OUTPATIENT_CLINIC_OR_DEPARTMENT_OTHER): Payer: Self-pay

## 2020-09-24 DIAGNOSIS — H401131 Primary open-angle glaucoma, bilateral, mild stage: Secondary | ICD-10-CM | POA: Diagnosis not present

## 2020-09-24 DIAGNOSIS — H31011 Macula scars of posterior pole (postinflammatory) (post-traumatic), right eye: Secondary | ICD-10-CM | POA: Diagnosis not present

## 2020-09-24 DIAGNOSIS — H1045 Other chronic allergic conjunctivitis: Secondary | ICD-10-CM | POA: Diagnosis not present

## 2020-09-24 DIAGNOSIS — H34211 Partial retinal artery occlusion, right eye: Secondary | ICD-10-CM | POA: Diagnosis not present

## 2020-09-28 ENCOUNTER — Ambulatory Visit: Payer: BC Managed Care – PPO | Admitting: Physician Assistant

## 2020-10-07 ENCOUNTER — Other Ambulatory Visit: Payer: Self-pay

## 2020-10-07 ENCOUNTER — Ambulatory Visit (INDEPENDENT_AMBULATORY_CARE_PROVIDER_SITE_OTHER): Payer: BC Managed Care – PPO | Admitting: Family Medicine

## 2020-10-07 ENCOUNTER — Encounter: Payer: Self-pay | Admitting: Family Medicine

## 2020-10-07 VITALS — BP 141/95 | HR 74 | Temp 98.2°F | Ht 72.0 in | Wt 190.8 lb

## 2020-10-07 DIAGNOSIS — E785 Hyperlipidemia, unspecified: Secondary | ICD-10-CM

## 2020-10-07 DIAGNOSIS — H34211 Partial retinal artery occlusion, right eye: Secondary | ICD-10-CM | POA: Diagnosis not present

## 2020-10-07 DIAGNOSIS — N401 Enlarged prostate with lower urinary tract symptoms: Secondary | ICD-10-CM | POA: Diagnosis not present

## 2020-10-07 DIAGNOSIS — E038 Other specified hypothyroidism: Secondary | ICD-10-CM

## 2020-10-07 DIAGNOSIS — N138 Other obstructive and reflux uropathy: Secondary | ICD-10-CM | POA: Diagnosis not present

## 2020-10-07 LAB — TSH: TSH: 6.19 u[IU]/mL — ABNORMAL HIGH (ref 0.35–5.50)

## 2020-10-07 NOTE — Assessment & Plan Note (Signed)
On Synthroid 25 mcg daily.  He has noticed a little bit more fatigue recently.  We will check TSH today and adjust dose as needed.

## 2020-10-07 NOTE — Assessment & Plan Note (Signed)
Not on any statins.  We will recheck lipids when he comes back for CPE in a couple of months.  Depending on result of carotid ultrasound may need to restart statin if he has any evidence of vascular disease.

## 2020-10-07 NOTE — Assessment & Plan Note (Signed)
Doing well status post TURP.

## 2020-10-07 NOTE — Progress Notes (Signed)
   Benjamin Chambers. is a 70 y.o. male who presents today for an office visit.  Assessment/Plan:  Chronic Problems Addressed Today: Hollenhorst plaque, right eye Possibly related to his recent surgical procedure.  We will check carotid Dopplers to rule out any carotid vascular disease.  BPH s/p TURP 06/2020 Doing well status post TURP.  Subclinical hypothyroidism On Synthroid 25 mcg daily.  He has noticed a little bit more fatigue recently.  We will check TSH today and adjust dose as needed.  Dyslipidemia Not on any statins.  We will recheck lipids when he comes back for CPE in a couple of months.  Depending on result of carotid ultrasound may need to restart statin if he has any evidence of vascular disease.     Subjective:  HPI:  See A/P for status of chronic conditions.  Patient is here for follow-up at the request of his eye doctor.  Recently had screening exam done for glaucoma.  Was noted to have Hollenhorst plaque in his right eye.  Recommended to follow-up to have vascular screening done.  He has not noticed any significant vision changes.  No weakness or numbness.  No dizziness.  No syncopal episodes.  Since last time I saw him his also underwent TURP via urology.  He is doing well at this.       Objective:  Physical Exam: BP (!) 141/95   Pulse 74   Temp 98.2 F (36.8 C) (Temporal)   Ht 6' (1.829 m)   Wt 190 lb 12.8 oz (86.5 kg)   SpO2 98%   BMI 25.88 kg/m   Gen: No acute distress, resting comfortably CV: Regular rate and rhythm with no murmurs appreciated.  No carotid bruits. Pulm: Normal work of breathing, clear to auscultation bilaterally with no crackles, wheezes, or rhonchi Neuro: Grossly normal, moves all extremities Psych: Normal affect and thought content      Ludivina Guymon M. Jerline Pain, MD 10/07/2020 1:37 PM

## 2020-10-07 NOTE — Assessment & Plan Note (Signed)
Possibly related to his recent surgical procedure.  We will check carotid Dopplers to rule out any carotid vascular disease.

## 2020-10-07 NOTE — Patient Instructions (Signed)
It was very nice to see you today!  We will order the ultrasound of the carotid arteries.  We will check your thyroid today.  I will see you back in a couple months for your annual physical.  Please come back to see me sooner if needed.  Take care, Dr Jerline Pain  PLEASE NOTE:  If you had any lab tests please let us know if you have not heard back within a few days. You may see your results on mychart before we have a chance to review them but we will give you a call once they are reviewed by [place]. If we ordered any refer we will rals today, please let us know if you have not heard from their office within the next week.   Please try these tips to maintain a healthy lifestyle:  Eat at least 3 REAL meals and 1-2 snacks per day.  Aim for no more than 5 hours between eating.  If you eat breakfast, please do so within one hour of getting up.   Each meal should contain half fruits/vegetables, one quarter protein, and one quarter carbs (no bigger than a computer mouse)  Cut down on sweet beverages. This includes juice, soda, and sweet tea.   Drink at least 1 glass of water with each meal and aim for at least 8 glasses per day  Exercise at least 150 minutes every week.

## 2020-10-07 NOTE — Progress Notes (Signed)
Please inform patient of the following:  Thyroid level is off.  Recommend increasing Synthroid to 50 mcg daily and we can recheck in 4 to 6 weeks.

## 2020-10-08 ENCOUNTER — Other Ambulatory Visit: Payer: Self-pay

## 2020-10-08 DIAGNOSIS — R7989 Other specified abnormal findings of blood chemistry: Secondary | ICD-10-CM

## 2020-10-08 MED ORDER — LEVOTHYROXINE SODIUM 50 MCG PO TABS: 50.0000 ug | ORAL_TABLET | Freq: Every day | ORAL | 1 refills | Status: DC

## 2020-10-12 ENCOUNTER — Other Ambulatory Visit: Payer: Self-pay

## 2020-10-12 ENCOUNTER — Ambulatory Visit (HOSPITAL_COMMUNITY)
Admission: RE | Admit: 2020-10-12 | Discharge: 2020-10-12 | Disposition: A | Discharge status: Home / Self Care | Payer: BC Managed Care – PPO | Source: Ambulatory Visit | Attending: Cardiovascular Disease | Admitting: Cardiovascular Disease

## 2020-10-12 DIAGNOSIS — H34211 Partial retinal artery occlusion, right eye: Secondary | ICD-10-CM | POA: Diagnosis not present

## 2020-10-14 NOTE — Progress Notes (Signed)
Please inform patient of the following:  Ultrasound of his neck was normal.  No signs of blockages or calcifications.

## 2020-10-31 ENCOUNTER — Other Ambulatory Visit: Payer: Self-pay | Admitting: Family Medicine

## 2020-11-18 ENCOUNTER — Other Ambulatory Visit: Payer: Self-pay

## 2020-11-18 ENCOUNTER — Other Ambulatory Visit (INDEPENDENT_AMBULATORY_CARE_PROVIDER_SITE_OTHER): Payer: BC Managed Care – PPO

## 2020-11-18 DIAGNOSIS — R7989 Other specified abnormal findings of blood chemistry: Secondary | ICD-10-CM

## 2020-11-18 LAB — TSH: TSH: 5.92 u[IU]/mL — ABNORMAL HIGH (ref 0.35–5.50)

## 2020-11-19 NOTE — Progress Notes (Signed)
Please inform patient of the following:  Thyroid levels are back the same as last time.  Recommend we increase the Synthroid dose to 75 mcg daily.  We should recheck in 6 weeks.

## 2020-11-21 ENCOUNTER — Other Ambulatory Visit: Payer: Self-pay | Admitting: Family Medicine

## 2020-11-22 ENCOUNTER — Other Ambulatory Visit: Payer: Self-pay

## 2020-11-24 ENCOUNTER — Other Ambulatory Visit: Payer: Self-pay | Admitting: *Deleted

## 2020-11-24 ENCOUNTER — Telehealth: Payer: Self-pay

## 2020-11-24 MED ORDER — LEVOTHYROXINE SODIUM 75 MCG PO TABS: 75.0000 ug | ORAL_TABLET | Freq: Every day | ORAL | 0 refills | Status: DC

## 2020-11-24 NOTE — Telephone Encounter (Signed)
Pt called regarding his labs from last week.

## 2020-11-25 DIAGNOSIS — H34211 Partial retinal artery occlusion, right eye: Secondary | ICD-10-CM | POA: Diagnosis not present

## 2020-11-25 DIAGNOSIS — H35363 Drusen (degenerative) of macula, bilateral: Secondary | ICD-10-CM | POA: Diagnosis not present

## 2020-11-25 DIAGNOSIS — H2513 Age-related nuclear cataract, bilateral: Secondary | ICD-10-CM | POA: Diagnosis not present

## 2020-11-25 DIAGNOSIS — H401131 Primary open-angle glaucoma, bilateral, mild stage: Secondary | ICD-10-CM | POA: Diagnosis not present

## 2020-11-27 ENCOUNTER — Other Ambulatory Visit: Payer: Self-pay | Admitting: Family Medicine

## 2020-12-02 ENCOUNTER — Encounter: Payer: BC Managed Care – PPO | Admitting: Family Medicine

## 2020-12-08 ENCOUNTER — Other Ambulatory Visit: Payer: Self-pay

## 2020-12-08 ENCOUNTER — Telehealth: Payer: Self-pay

## 2020-12-08 ENCOUNTER — Ambulatory Visit (INDEPENDENT_AMBULATORY_CARE_PROVIDER_SITE_OTHER): Payer: BC Managed Care – PPO | Admitting: Family Medicine

## 2020-12-08 ENCOUNTER — Encounter: Payer: Self-pay | Admitting: Family Medicine

## 2020-12-08 VITALS — BP 144/88 | HR 80 | Temp 97.5°F | Ht 72.0 in | Wt 199.8 lb

## 2020-12-08 DIAGNOSIS — I10 Essential (primary) hypertension: Secondary | ICD-10-CM

## 2020-12-08 DIAGNOSIS — E038 Other specified hypothyroidism: Secondary | ICD-10-CM

## 2020-12-08 DIAGNOSIS — Z125 Encounter for screening for malignant neoplasm of prostate: Secondary | ICD-10-CM

## 2020-12-08 DIAGNOSIS — Z6827 Body mass index (BMI) 27.0-27.9, adult: Secondary | ICD-10-CM

## 2020-12-08 DIAGNOSIS — K51919 Ulcerative colitis, unspecified with unspecified complications: Secondary | ICD-10-CM

## 2020-12-08 DIAGNOSIS — E785 Hyperlipidemia, unspecified: Secondary | ICD-10-CM

## 2020-12-08 DIAGNOSIS — E663 Overweight: Secondary | ICD-10-CM

## 2020-12-08 DIAGNOSIS — N138 Other obstructive and reflux uropathy: Secondary | ICD-10-CM

## 2020-12-08 DIAGNOSIS — Z0001 Encounter for general adult medical examination with abnormal findings: Secondary | ICD-10-CM

## 2020-12-08 DIAGNOSIS — R739 Hyperglycemia, unspecified: Secondary | ICD-10-CM | POA: Diagnosis not present

## 2020-12-08 DIAGNOSIS — N401 Enlarged prostate with lower urinary tract symptoms: Secondary | ICD-10-CM

## 2020-12-08 NOTE — Telephone Encounter (Signed)
Future PSA ordered. 

## 2020-12-08 NOTE — Telephone Encounter (Signed)
Patient would like to add PSA to his lab work when he comes back on 9/27 for his lab appt.

## 2020-12-08 NOTE — Assessment & Plan Note (Signed)
Doing well status post TURP.

## 2020-12-08 NOTE — Assessment & Plan Note (Signed)
Check A1c with labs.

## 2020-12-08 NOTE — Patient Instructions (Signed)
It was very nice to see you today!  We will recheck blood work in a couple of weeks.  Please continue the good work with your diet and exercise.  I will see back in year for your next checkup.  Come back to see me sooner if needed.  Take care, Dr Jerline Pain  PLEASE NOTE:  If you had any lab tests please let us know if you have not heard back within a few days. You may see your results on mychart before we have a chance to review them but we will give you a call once they are reviewed by [place]. If we ordered any referrals today, please let us know if you have not heard from their office within the next week.   Please try these tips to maintain a healthy lifestyle:  Eat at least 3 REAL meals and 1-2 snacks per day.  Aim for no more than 5 hours between eating.  If you eat breakfast, please do so within one hour of getting up.   Each meal should contain half fruits/vegetables, one quarter protein, and one quarter carbs (no bigger than a computer mouse)  Cut down on sweet beverages. This includes juice, soda, and sweet tea.   Drink at least 1 glass of water with each meal and aim for at least 8 glasses per day  Exercise at least 150 minutes every week.    Preventive Care 23 Years and Older, Male Preventive care refers to lifestyle choices and visits with your health care provider that can promote health and wellness. This includes: A yearly physical exam. This is also called an annual wellness visit. Regular dental and eye exams. Immunizations. Screening for certain conditions. Healthy lifestyle choices, such as: Eating a healthy diet. Getting regular exercise. Not using drugs or products that contain nicotine and tobacco. Limiting alcohol use. What can I expect for my preventive care visit? Physical exam Your health care provider will check your: Height and weight. These may be used to calculate your BMI (body mass index). BMI is a measurement that tells if you are at a healthy  weight. Heart rate and blood pressure. Body temperature. Skin for abnormal spots. Counseling Your health care provider may ask you questions about your: Past medical problems. Family's medical history. Alcohol, tobacco, and drug use. Emotional well-being. Home life and relationship well-being. Sexual activity. Diet, exercise, and sleep habits. History of falls. Memory and ability to understand (cognition). Work and work Statistician. Access to firearms. What immunizations do I need? Vaccines are usually given at various ages, according to a schedule. Your health care provider will recommend vaccines for you based on your age, medical history, and lifestyle or other factors, such as travel or where you work. What tests do I need? Blood tests Lipid and cholesterol levels. These may be checked every 5 years, or more often depending on your overall health. Hepatitis C test. Hepatitis B test. Screening Lung cancer screening. You may have this screening every year starting at age 36 if you have a 30-pack-year history of smoking and currently smoke or have quit within the past 15 years. Colorectal cancer screening. All adults should have this screening starting at age 7 and continuing until age 64. Your health care provider may recommend screening at age 51 if you are at increased risk. You will have tests every 1-10 years, depending on your results and the type of screening test. Prostate cancer screening. Recommendations will vary depending on your family history and other risks.  Genital exam to check for testicular cancer or hernias. Diabetes screening. This is done by checking your blood sugar (glucose) after you have not eaten for a while (fasting). You may have this done every 1-3 years. Abdominal aortic aneurysm (AAA) screening. You may need this if you are a current or former smoker. STD (sexually transmitted disease) testing, if you are at risk. Follow these instructions at  home: Eating and drinking  Eat a diet that includes fresh fruits and vegetables, whole grains, lean protein, and low-fat dairy products. Limit your intake of foods with high amounts of sugar, saturated fats, and salt. Take vitamin and mineral supplements as recommended by your health care provider. Do not drink alcohol if your health care provider tells you not to drink. If you drink alcohol: Limit how much you have to 0-2 drinks a day. Be aware of how much alcohol is in your drink. In the [place] one drink equals one 12 oz bottle of beer (355 mL), one 5 oz glass of wine (148 mL), or one 1 oz glass of hard liquor (44 mL). Lifestyle Take daily care of your teeth and gums. Brush your teeth every morning and night with fluoride toothpaste. Floss one time each day. Stay active. Exercise for at least 30 minutes 5 or more days each week. Do not use any products that contain nicotine or tobacco, such as cigarettes, e-cigarettes, and chewing tobacco. If you need help quitting, ask your health care provider. Do not use drugs. If you are sexually active, practice safe sex. Use a condom or other form of protection to prevent STIs (sexually transmitted infections). Talk with your health care provider about taking a low-dose aspirin or statin. Find healthy ways to cope with stress, such as: Meditation, yoga, or listening to music. Journaling. Talking to a trusted person. Spending time with friends and family. Safety Always wear your seat belt while driving or riding in a vehicle. Do not drive: If you have been drinking alcohol. Do not ride with someone who has been drinking. When you are tired or distracted. While texting. Wear a helmet and other protective equipment during sports activities. If you have firearms in your house, make sure you follow all gun safety procedures. What's next? Visit your health care provider once a year for an annual wellness visit. Ask your health care provider how often  you should have your eyes and teeth checked. Stay up to date on all vaccines. This information is not intended to replace advice given to you by your health care provider. Make sure you discuss any questions you have with your health care provider. Document Revised: 05/28/2020 Document Reviewed: 03/14/2018 Elsevier Patient Education  2022 [employer].

## 2020-12-08 NOTE — Assessment & Plan Note (Signed)
At goal per [facility]. Check labs. Continue lisinopril '10mg'$  daily.

## 2020-12-08 NOTE — Progress Notes (Signed)
Chief Complaint:  Benjamin Chambers. is a 70 y.o. male who presents today for his annual comprehensive physical exam.    Assessment/Plan:  Chronic Problems Addressed Today: BPH s/p TURP 06/2020 Doing well status post TURP.  Subclinical hypothyroidism On Synthroid 75 mcg daily.  He will come back to check TSH in a couple of weeks.  We can adjust dose as needed.  He feels like he is doing well on current dose.  Hyperglycemia Check A1c with labs.  Dyslipidemia Check lipids with next blood draw.   Ulcerative colitis ([place]) Stable on liada per GI.   Hypertension At goal per [facility]. Check labs. Continue lisinopril '10mg'$  daily.    Body mass index is 27.1 kg/m. / Overweight  BMI Metric Follow Up - 12/08/20 1012       BMI Metric Follow Up-Please document annually   BMI Metric Follow Up Education provided              Preventative Healthcare: Check Labs. UTD on vaccines. Will be getting covid and flu boosters later this year.   Patient Counseling(The following topics were reviewed and/or handout was given):  -Nutrition: Stressed importance of moderation in sodium/caffeine intake, saturated fat and cholesterol, caloric balance, sufficient intake of fresh fruits, vegetables, and fiber.  -Stressed the importance of regular exercise.   -Substance Abuse: Discussed cessation/primary prevention of tobacco, alcohol, or other drug use; driving or other dangerous activities under the influence; availability of treatment for abuse.   -Injury prevention: Discussed safety belts, safety helmets, smoke detector, smoking near bedding or upholstery.   -Sexuality: Discussed sexually transmitted diseases, partner selection, use of condoms, avoidance of unintended pregnancy and contraceptive alternatives.   -Dental health: Discussed importance of regular tooth brushing, flossing, and dental visits.  -Health maintenance and immunizations reviewed. Please refer to Health maintenance section.  Return  to care in 1 year for next preventative visit.     Subjective:  HPI:  He has no acute complaints today.   He recently notes that Levothyroxine 75 mg dosage has began to start working, and is hopeful that it will be effective.   Also, he states that he has been feeling better this year than he did last year, which he at least in part attributes to his prostate operation which has improved his sleep and increased productive urination.  He is compliant with all medications with no notable side effects.  Lifestyle Diet: Reasonably healthy diet, but can improve Exercise: Exercises and walks; no stated regimen  Depression screen [facility] 2/9 12/08/2020  Decreased Interest 0  Down, Depressed, Hopeless 0  PHQ - 2 Score 0  Altered sleeping -  Tired, decreased energy -  Change in appetite -  Feeling bad or failure about yourself  -  Trouble concentrating -  Moving slowly or fidgety/restless -  Suicidal thoughts -  PHQ-9 Score -    Health Maintenance Due  Topic Date Due   COVID-19 Vaccine (5 - Booster for Pfizer series) 11/29/2020     ROS: Per HPI, otherwise a complete review of systems was negative.   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 No pertinent family history.  Medications- reviewed and updated Current Outpatient Medications  Medication Sig Dispense Refill   brimonidine-timolol (COMBIGAN) 0.2-0.5 % ophthalmic solution Place 1 drop into both eyes every 12 (twelve) hours.     COVID-19 mRNA Vac-TriS, Pfizer, (PFIZER-BIONT COVID-19 VAC-TRIS) SUSP injection Inject into the muscle. 0.3 mL 0   Garlic 123XX123 MG CAPS Take 1,000 mg by mouth daily.     glucosamine-chondroitin 500-400 MG tablet Take 1 tablet by mouth daily.     KRILL OIL PO Take 520 mg by mouth daily.     latanoprost (XALATAN) 0.005 % ophthalmic solution Place 1 drop into both eyes at bedtime.     levothyroxine (SYNTHROID) 75 MCG tablet Take 1 tablet (75 mcg total) by mouth daily. 90 tablet 0   lisinopril (ZESTRIL) 10 MG tablet TAKE 1 TABLET BY MOUTH EVERY DAY 90 tablet 3   mesalamine (LIALDA) 1.2 g EC tablet Take 2.4 g by mouth daily with breakfast.     Multiple Vitamins-Minerals (PRESERVISION AREDS PO) Take 1 tablet by mouth daily.     Multiple Vitamins-Minerals (SPECTRAVITE) TABS Take 1 tablet by mouth daily.     niacin 500 MG tablet Take 500 mg by mouth 3 (three) times a week.     Plant Sterols and Stanols 450 MG TABS Take 900 mg by mouth in the morning and at bedtime.     Red Yeast Rice Extract (RED YEAST RICE PO) Take 1,200 mg by mouth daily.     Turmeric 500 MG CAPS Take 500 mg by mouth 2 (two) times a week.     No current facility-administered medications for this visit.    Allergies-reviewed and updated Allergies  Allergen Reactions   Penicillins     Family history only   Strawberry Extract Hives and Itching   Codeine Nausea Only    Social History   Socioeconomic History   Marital status: Married    Spouse name: Not on file   Number of children: Not on file   Years of education: Not on file    Highest education level: Not on file  Occupational History   Not on file  Tobacco Use   Smoking status: Former   Smokeless tobacco: Never  Vaping Use   Vaping Use: Never used  Substance and Sexual Activity   Alcohol use: Yes    Comment: Occasional   Drug use: No   Sexual activity: Not on file  Other Topics Concern   Not on file  Social History Narrative   Not on file   Social Determinants of Health   Financial Resource Strain: Not on file  Food Insecurity: Not on file  Transportation Needs: Not on file  Physical Activity: Not on file  Stress: Not on file  Social Connections: Not on file        Objective:  Physical  Exam: BP (!) 144/88   Pulse 80   Temp (!) 97.5 F (36.4 C) (Temporal)   Ht 6' (1.829 m)   Wt 199 lb 12.8 oz (90.6 kg)   SpO2 98%   BMI 27.10 kg/m   Body mass index is 27.1 kg/m. Wt Readings from Last 3 Encounters:  12/08/20 199 lb 12.8 oz (90.6 kg)  10/07/20 190 lb 12.8 oz (86.5 kg)  06/18/20 184 lb 6.4 oz (83.6 kg)   Gen: NAD, resting comfortably HEENT: TMs normal bilaterally. OP clear. No thyromegaly noted.  CV: RRR with no murmurs appreciated Pulm: NWOB, CTAB with no crackles, wheezes, or rhonchi GI: Normal bowel sounds present. Soft, Nontender, Nondistended. MSK: no edema, cyanosis, or clubbing noted Skin: warm, dry Neuro: CN2-12 grossly intact. Strength 5/5 in upper and lower extremities. Reflexes symmetric and intact bilaterally.  Psych: Normal affect and thought content     I,Jordan Kelly,acting as a scribe for Dimas Chyle, MD.,have documented all relevant documentation on the behalf of Dimas Chyle, MD,as directed by  Dimas Chyle, MD while in the presence of Dimas Chyle, MD.  I, Dimas Chyle, MD, have reviewed all documentation for this visit. The documentation on 12/08/20 for the exam, diagnosis, procedures, and orders are all accurate and complete.  Algis Greenhouse. Jerline Pain, MD 12/08/2020 10:13 AM

## 2020-12-08 NOTE — Assessment & Plan Note (Signed)
Check lipids with next blood draw.

## 2020-12-08 NOTE — Assessment & Plan Note (Signed)
Stable on liada per GI.

## 2020-12-08 NOTE — Assessment & Plan Note (Signed)
On Synthroid 75 mcg daily.  He will come back to check TSH in a couple of weeks.  We can adjust dose as needed.  He feels like he is doing well on current dose.

## 2020-12-28 ENCOUNTER — Other Ambulatory Visit: Payer: BC Managed Care – PPO

## 2020-12-30 ENCOUNTER — Other Ambulatory Visit: Payer: Self-pay

## 2020-12-30 ENCOUNTER — Other Ambulatory Visit (INDEPENDENT_AMBULATORY_CARE_PROVIDER_SITE_OTHER): Payer: BC Managed Care – PPO

## 2020-12-30 DIAGNOSIS — Z0001 Encounter for general adult medical examination with abnormal findings: Secondary | ICD-10-CM | POA: Diagnosis not present

## 2020-12-30 DIAGNOSIS — Z125 Encounter for screening for malignant neoplasm of prostate: Secondary | ICD-10-CM | POA: Diagnosis not present

## 2020-12-30 DIAGNOSIS — E038 Other specified hypothyroidism: Secondary | ICD-10-CM | POA: Diagnosis not present

## 2020-12-30 DIAGNOSIS — R739 Hyperglycemia, unspecified: Secondary | ICD-10-CM

## 2020-12-30 DIAGNOSIS — E785 Hyperlipidemia, unspecified: Secondary | ICD-10-CM | POA: Diagnosis not present

## 2020-12-30 LAB — HEMOGLOBIN A1C: Hgb A1c MFr Bld: 5.8 % (ref 4.6–6.5)

## 2020-12-30 LAB — CBC
HCT: 39.8 % (ref 39.0–52.0)
Hemoglobin: 13.4 g/dL (ref 13.0–17.0)
MCHC: 33.8 g/dL (ref 30.0–36.0)
MCV: 87.3 fl (ref 78.0–100.0)
Platelets: 314 10*3/uL (ref 150.0–400.0)
RBC: 4.56 Mil/uL (ref 4.22–5.81)
RDW: 13.7 % (ref 11.5–15.5)
WBC: 7.4 10*3/uL (ref 4.0–10.5)

## 2020-12-30 LAB — COMPREHENSIVE METABOLIC PANEL
ALT: 42 U/L (ref 0–53)
AST: 31 U/L (ref 0–37)
Albumin: 3.8 g/dL (ref 3.5–5.2)
Alkaline Phosphatase: 63 U/L (ref 39–117)
BUN: 25 mg/dL — ABNORMAL HIGH (ref 6–23)
CO2: 27 mEq/L (ref 19–32)
Calcium: 9.3 mg/dL (ref 8.4–10.5)
Chloride: 103 mEq/L (ref 96–112)
Creatinine, Ser: 1.31 mg/dL (ref 0.40–1.50)
GFR: 55.31 mL/min — ABNORMAL LOW (ref >60.00)
Glucose, Bld: 81 mg/dL (ref 70–99)
Potassium: 4.3 mEq/L (ref 3.5–5.1)
Sodium: 137 mEq/L (ref 135–145)
Total Bilirubin: 0.3 mg/dL (ref 0.2–1.2)
Total Protein: 6.8 g/dL (ref 6.0–8.3)

## 2020-12-30 LAB — LIPID PANEL
Cholesterol: 154 mg/dL (ref 0–200)
HDL: 34.4 mg/dL — ABNORMAL LOW (ref >39.00)
LDL Cholesterol: 80 mg/dL (ref 0–99)
NonHDL: 119.68
Total CHOL/HDL Ratio: 4
Triglycerides: 198 mg/dL — ABNORMAL HIGH (ref 0.0–149.0)
VLDL: 39.6 mg/dL (ref 0.0–40.0)

## 2020-12-30 LAB — TSH: TSH: 5.42 u[IU]/mL (ref 0.35–5.50)

## 2020-12-30 LAB — PSA: PSA: 0.99 ng/mL (ref 0.10–4.00)

## 2021-01-03 NOTE — Progress Notes (Signed)
Please inform patient of the following:  His blood sugar is borderline but stable.  Everything else is stable.  No need to make any changes to his treatment plan at this time.  We can recheck at his next office visit.  Benjamin Chambers. Jerline Pain, MD 01/03/2021 8:00 AM

## 2021-02-11 IMAGING — US US RENAL
1 series · 14 of 25 positions shown · non-contrast
Comparison: None.

CLINICAL DATA: Stage III A chronic kidney disease

EXAM:
RENAL / URINARY TRACT ULTRASOUND COMPLETE

[Series 1: us renal · 0.26mm/px · 14 of 60 slices shown]
[im 1/60]
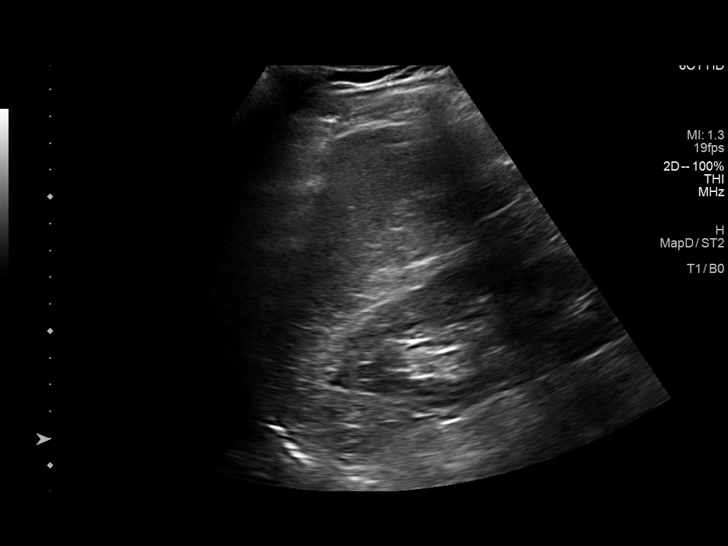
[im 5/60]
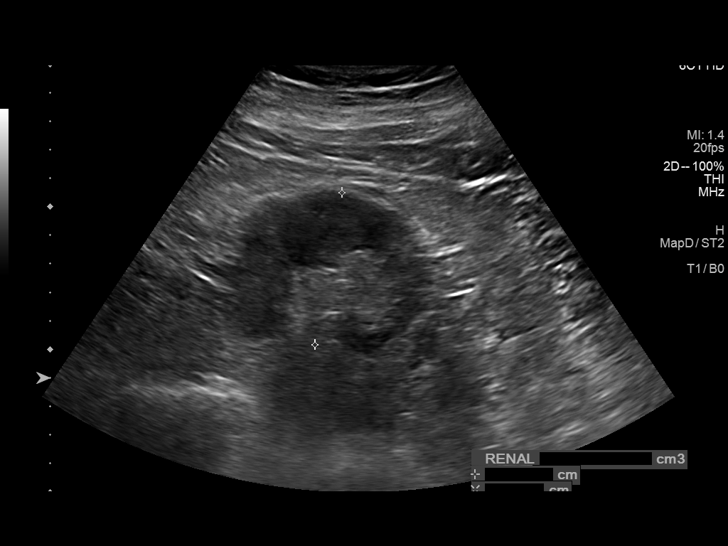
[im 10/60]
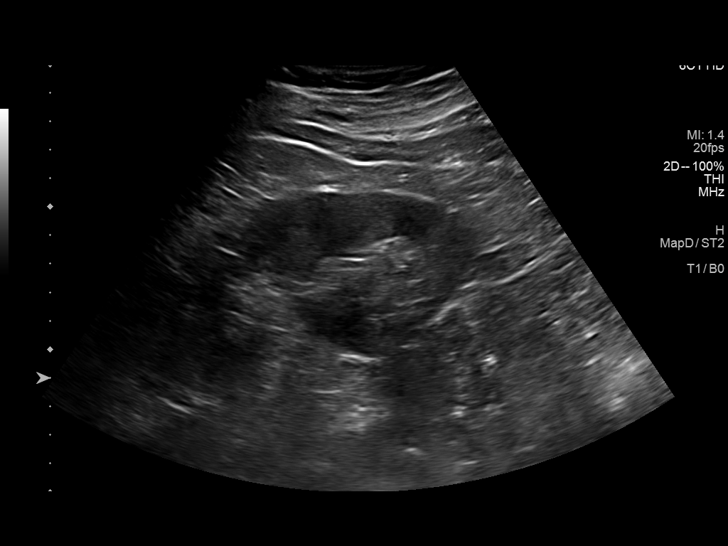
[im 15/60]
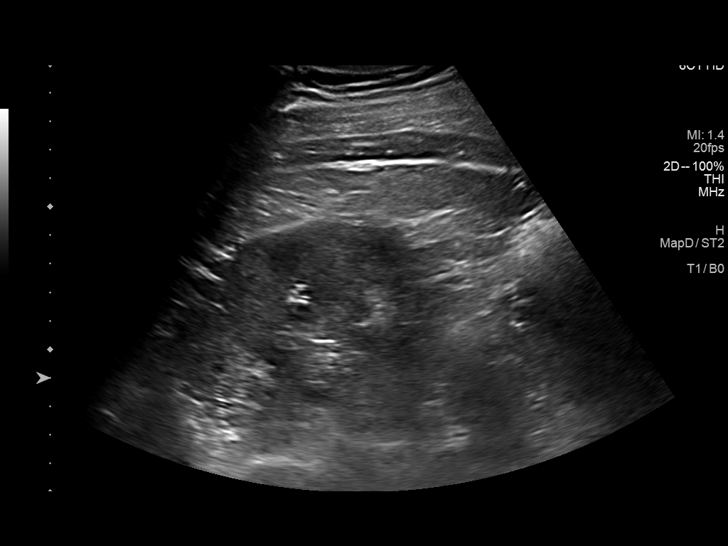
[im 20/60]
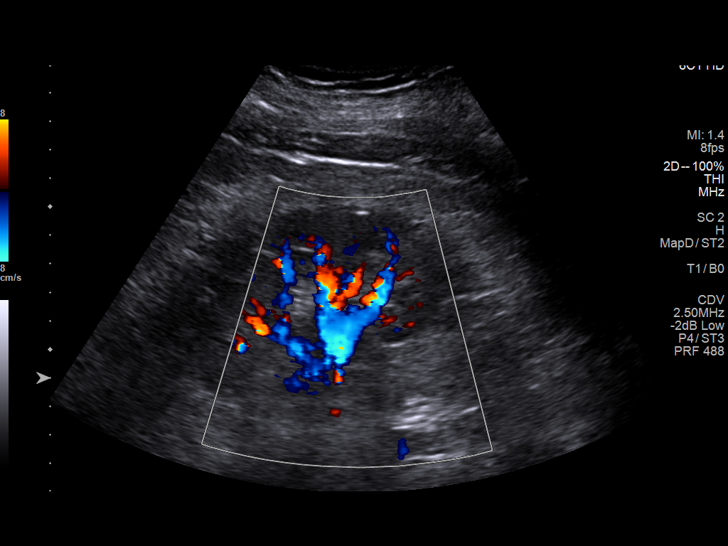
[im 23/60]
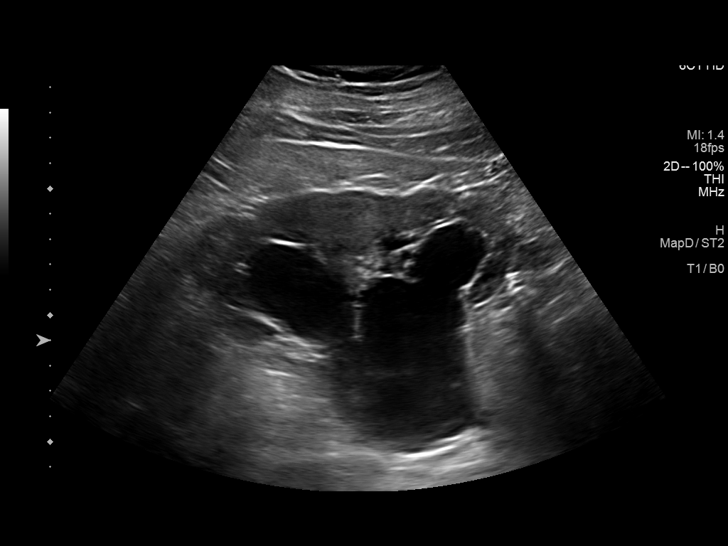
[im 28/60]
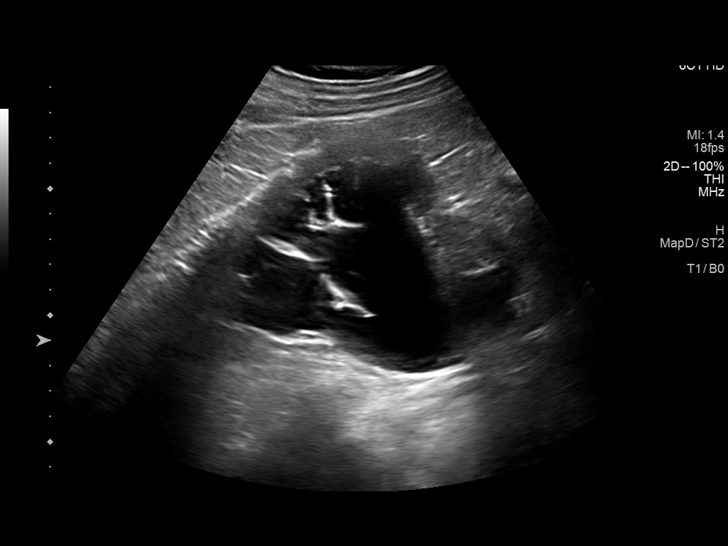
[im 32/60]
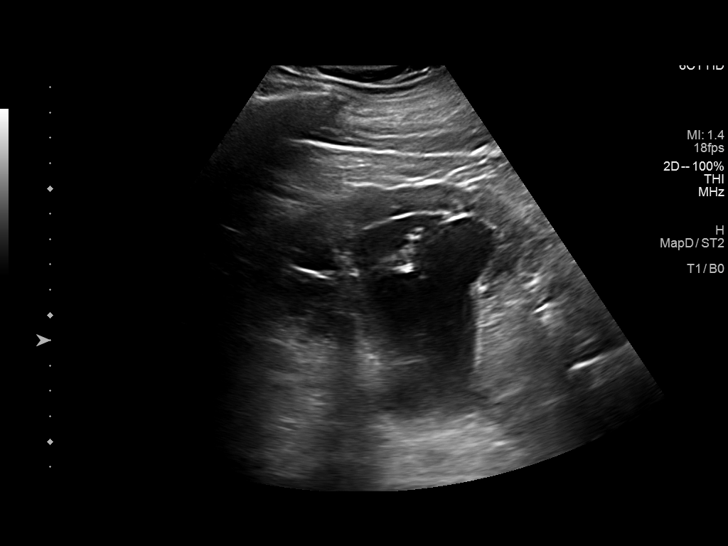
[im 37/60]
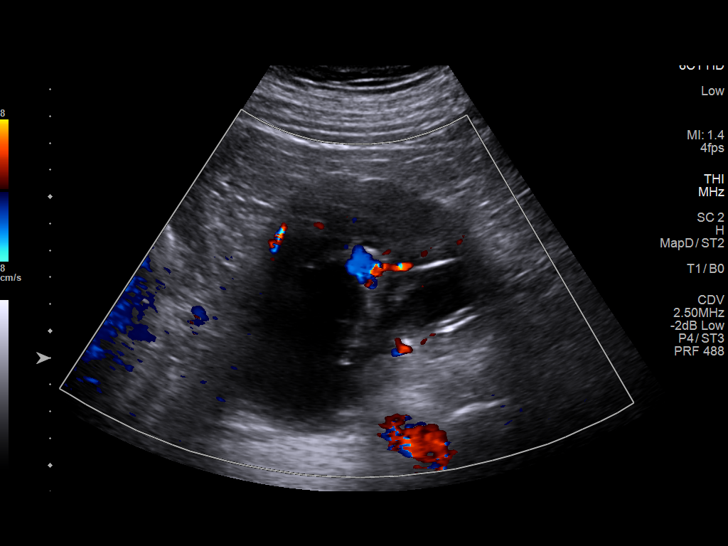
[im 40/60]
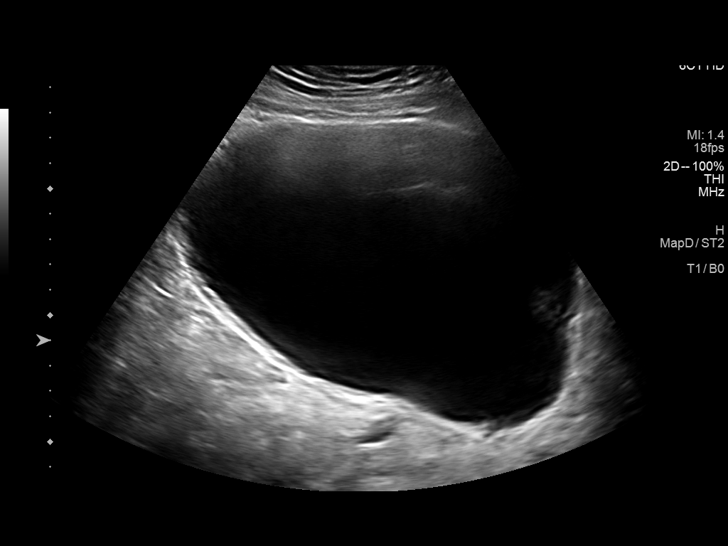
[im 45/60]
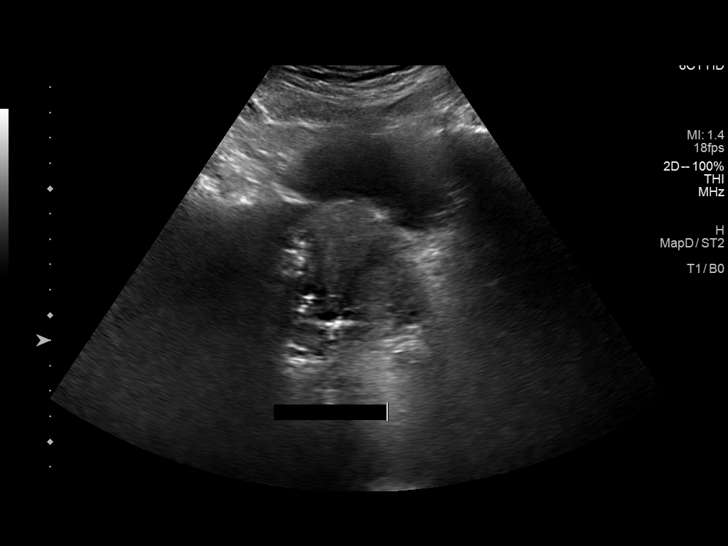
[im 50/60]
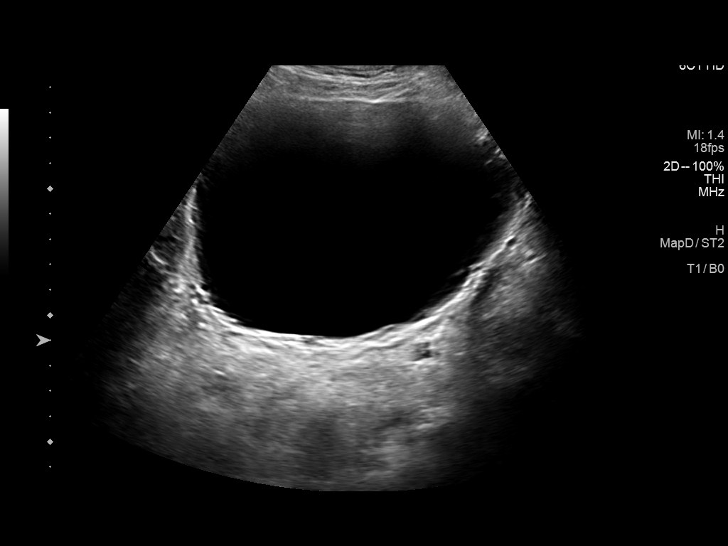
[im 55/60]
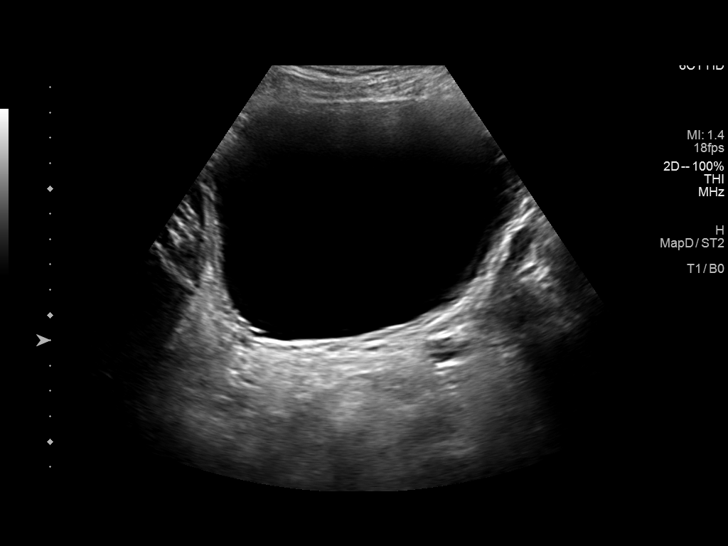
[im 60/60]
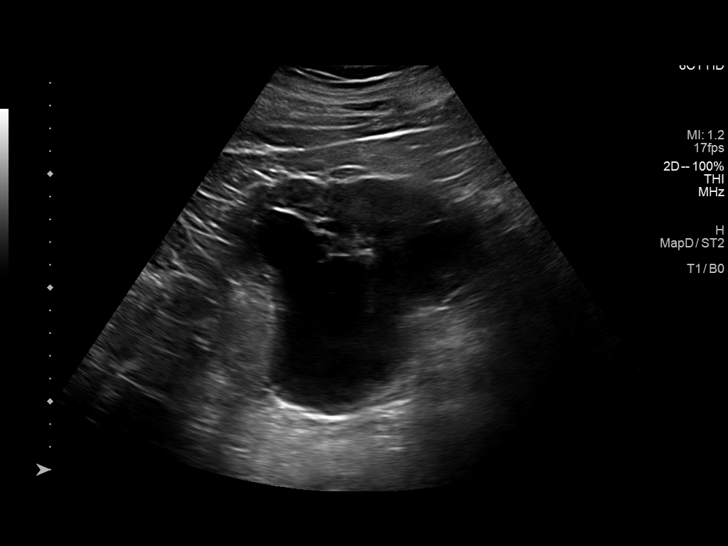

[14 of 25 positions shown; findings below may reference images not displayed]

FINDINGS: Right Kidney:

Renal measurements: 12.5 x 6.2 x 5.4 cm = volume: 219 mL.
Echogenicity within normal limits. No mass or hydronephrosis
visualized.

Left Kidney:

Renal measurements: 13.5 x 5.3 x 6.9 cm = volume: 281 mL. None there
is severe left hydronephrosis. Cortical thickness is preserved and
cortical echogenicity, however, is normal. No intrarenal masses or
calcifications are seen.

Bladder:

The bladder is distended appears trabeculated in keeping with
chronic bladder outlet obstruction. The pre void bladder volume is
3863 cc. Postvoid bladder volume is 1112 cc. There is prominent
indentation of the base of the bladder by the hypertrophied central
lobe of the prostate gland.

Other:

None.
IMPRESSION: Severe left hydronephrosis.  Preserved left renal cortex.

Marked bladder distension with large postvoid residual (greater than
1 L). Bladder wall trabeculation suggests chronic bladder outlet
obstruction, possibly limited to central prostatic hypertrophy.

## 2021-02-14 DIAGNOSIS — C44722 Squamous cell carcinoma of skin of right lower limb, including hip: Secondary | ICD-10-CM | POA: Diagnosis not present

## 2021-02-14 DIAGNOSIS — Z85828 Personal history of other malignant neoplasm of skin: Secondary | ICD-10-CM | POA: Diagnosis not present

## 2021-02-14 DIAGNOSIS — D485 Neoplasm of uncertain behavior of skin: Secondary | ICD-10-CM | POA: Diagnosis not present

## 2021-02-14 DIAGNOSIS — Z08 Encounter for follow-up examination after completed treatment for malignant neoplasm: Secondary | ICD-10-CM | POA: Diagnosis not present

## 2021-02-14 DIAGNOSIS — B078 Other viral warts: Secondary | ICD-10-CM | POA: Diagnosis not present

## 2021-02-14 DIAGNOSIS — R229 Localized swelling, mass and lump, unspecified: Secondary | ICD-10-CM | POA: Diagnosis not present

## 2021-02-14 DIAGNOSIS — L821 Other seborrheic keratosis: Secondary | ICD-10-CM | POA: Diagnosis not present

## 2021-02-14 DIAGNOSIS — D1801 Hemangioma of skin and subcutaneous tissue: Secondary | ICD-10-CM | POA: Diagnosis not present

## 2021-02-19 ENCOUNTER — Other Ambulatory Visit: Payer: Self-pay | Admitting: Family Medicine

## 2021-04-05 ENCOUNTER — Encounter: Payer: Self-pay | Admitting: Family Medicine

## 2021-04-07 ENCOUNTER — Other Ambulatory Visit: Payer: Self-pay

## 2021-04-07 ENCOUNTER — Other Ambulatory Visit (INDEPENDENT_AMBULATORY_CARE_PROVIDER_SITE_OTHER): Payer: BC Managed Care – PPO

## 2021-04-07 ENCOUNTER — Other Ambulatory Visit: Payer: Self-pay | Admitting: *Deleted

## 2021-04-07 DIAGNOSIS — E039 Hypothyroidism, unspecified: Secondary | ICD-10-CM | POA: Diagnosis not present

## 2021-04-07 LAB — TSH: TSH: 1.51 u[IU]/mL (ref 0.35–5.50)

## 2021-04-07 NOTE — Telephone Encounter (Signed)
LVM to give [place] a call back to schedule lab appointment Lab order placed

## 2021-04-07 NOTE — Telephone Encounter (Signed)
Pt called to schedule labs. No orders in for labs. Please advise.

## 2021-04-08 ENCOUNTER — Other Ambulatory Visit: Payer: Self-pay

## 2021-04-08 MED ORDER — LEVOTHYROXINE SODIUM 75 MCG PO TABS
75.0000 ug | ORAL_TABLET | Freq: Every day | ORAL | 0 refills | Status: DC
Start: 2021-04-08 — End: 2021-05-20

## 2021-04-08 NOTE — Progress Notes (Signed)
Please inform patient of the following:  TSH is at goal. HE can continue his current dose of thyroid medication.  Algis Greenhouse. Jerline Pain, MD 04/08/2021 8:23 AM

## 2021-04-29 DIAGNOSIS — H401131 Primary open-angle glaucoma, bilateral, mild stage: Secondary | ICD-10-CM | POA: Diagnosis not present

## 2021-04-29 DIAGNOSIS — H35013 Changes in retinal vascular appearance, bilateral: Secondary | ICD-10-CM | POA: Diagnosis not present

## 2021-04-29 DIAGNOSIS — H35363 Drusen (degenerative) of macula, bilateral: Secondary | ICD-10-CM | POA: Diagnosis not present

## 2021-04-29 DIAGNOSIS — H31011 Macula scars of posterior pole (postinflammatory) (post-traumatic), right eye: Secondary | ICD-10-CM | POA: Diagnosis not present

## 2021-05-20 ENCOUNTER — Other Ambulatory Visit: Payer: Self-pay | Admitting: Family Medicine

## 2021-06-14 LAB — BASIC METABOLIC PANEL
BUN: 19 (ref 4–21)
CO2: 29 — AB (ref 13–22)
Chloride: 101 (ref 99–108)
Creatinine: 1.3 (ref 0.6–1.3)
Glucose: 105
Potassium: 4.3 mEq/L (ref 3.5–5.1)
Sodium: 138 (ref 137–147)

## 2021-06-14 LAB — COMPREHENSIVE METABOLIC PANEL
Albumin: 4.9 (ref 3.5–5.0)
Calcium: 10.5 (ref 8.7–10.7)
eGFR: 60

## 2021-06-28 ENCOUNTER — Encounter: Payer: Self-pay | Admitting: Family Medicine

## 2021-08-10 ENCOUNTER — Other Ambulatory Visit: Payer: Self-pay | Admitting: Family Medicine

## 2021-10-28 ENCOUNTER — Other Ambulatory Visit: Payer: Self-pay | Admitting: Family Medicine

## 2021-11-20 ENCOUNTER — Other Ambulatory Visit: Payer: Self-pay | Admitting: Family Medicine

## 2021-12-12 ENCOUNTER — Ambulatory Visit (INDEPENDENT_AMBULATORY_CARE_PROVIDER_SITE_OTHER): Payer: Medicare PPO | Admitting: Family Medicine

## 2021-12-12 ENCOUNTER — Encounter: Payer: Self-pay | Admitting: Family Medicine

## 2021-12-12 VITALS — BP 138/90 | HR 72 | Temp 97.6°F | Ht 72.0 in | Wt 199.0 lb

## 2021-12-12 DIAGNOSIS — E785 Hyperlipidemia, unspecified: Secondary | ICD-10-CM | POA: Diagnosis not present

## 2021-12-12 DIAGNOSIS — R739 Hyperglycemia, unspecified: Secondary | ICD-10-CM

## 2021-12-12 DIAGNOSIS — K219 Gastro-esophageal reflux disease without esophagitis: Secondary | ICD-10-CM | POA: Diagnosis not present

## 2021-12-12 DIAGNOSIS — I10 Essential (primary) hypertension: Secondary | ICD-10-CM | POA: Diagnosis not present

## 2021-12-12 DIAGNOSIS — R7989 Other specified abnormal findings of blood chemistry: Secondary | ICD-10-CM

## 2021-12-12 DIAGNOSIS — E038 Other specified hypothyroidism: Secondary | ICD-10-CM

## 2021-12-12 DIAGNOSIS — K51919 Ulcerative colitis, unspecified with unspecified complications: Secondary | ICD-10-CM

## 2021-12-12 DIAGNOSIS — N401 Enlarged prostate with lower urinary tract symptoms: Secondary | ICD-10-CM | POA: Diagnosis not present

## 2021-12-12 DIAGNOSIS — N138 Other obstructive and reflux uropathy: Secondary | ICD-10-CM | POA: Diagnosis not present

## 2021-12-12 DIAGNOSIS — Z0001 Encounter for general adult medical examination with abnormal findings: Secondary | ICD-10-CM | POA: Diagnosis not present

## 2021-12-12 LAB — LDL CHOLESTEROL, DIRECT: Direct LDL: 123 mg/dL

## 2021-12-12 LAB — COMPREHENSIVE METABOLIC PANEL
ALT: 20 U/L (ref 0–53)
AST: 18 U/L (ref 0–37)
Albumin: 4.2 g/dL (ref 3.5–5.2)
Alkaline Phosphatase: 66 U/L (ref 39–117)
BUN: 17 mg/dL (ref 6–23)
CO2: 28 mEq/L (ref 19–32)
Calcium: 9.6 mg/dL (ref 8.4–10.5)
Chloride: 103 mEq/L (ref 96–112)
Creatinine, Ser: 1.38 mg/dL (ref 0.40–1.50)
GFR: 51.61 mL/min — ABNORMAL LOW (ref >60.00)
Glucose, Bld: 106 mg/dL — ABNORMAL HIGH (ref 70–99)
Potassium: 4.3 mEq/L (ref 3.5–5.1)
Sodium: 138 mEq/L (ref 135–145)
Total Bilirubin: 0.5 mg/dL (ref 0.2–1.2)
Total Protein: 7 g/dL (ref 6.0–8.3)

## 2021-12-12 LAB — CBC
HCT: 43.9 % (ref 39.0–52.0)
Hemoglobin: 15.1 g/dL (ref 13.0–17.0)
MCHC: 34.5 g/dL (ref 30.0–36.0)
MCV: 87.9 fl (ref 78.0–100.0)
Platelets: 229 10*3/uL (ref 150.0–400.0)
RBC: 5 Mil/uL (ref 4.22–5.81)
RDW: 13.7 % (ref 11.5–15.5)
WBC: 5.7 10*3/uL (ref 4.0–10.5)

## 2021-12-12 LAB — TSH: TSH: 4.08 u[IU]/mL (ref 0.35–5.50)

## 2021-12-12 LAB — PSA: PSA: 1.13 ng/mL (ref 0.10–4.00)

## 2021-12-12 LAB — HEMOGLOBIN A1C: Hgb A1c MFr Bld: 5.8 % (ref 4.6–6.5)

## 2021-12-12 LAB — LIPID PANEL
Cholesterol: 193 mg/dL (ref 0–200)
HDL: 37.7 mg/dL — ABNORMAL LOW (ref >39.00)
NonHDL: 155.71
Total CHOL/HDL Ratio: 5
Triglycerides: 282 mg/dL — ABNORMAL HIGH (ref 0.0–149.0)
VLDL: 56.4 mg/dL — ABNORMAL HIGH (ref 0.0–40.0)

## 2021-12-12 MED ORDER — LISINOPRIL 20 MG PO TABS: 20.0000 mg | ORAL_TABLET | Freq: Every day | ORAL | 3 refills | Status: DC

## 2021-12-12 NOTE — Progress Notes (Signed)
Chief Complaint:  Benjamin Chambers. is a 71 y.o. male who presents today for his annual comprehensive physical exam.    Assessment/Plan:  Chronic Problems Addressed Today: Hypertension Slightly elevated today. Nephrology wants goal BP to be less than 130/80. He has been above this at home. We will increase lisinopril to 18m daily.  He will continue to monitor at home and [place]  BPH s/p TURP 06/2020 Doing well status post TURP.  No longer follows with urology.  Elevated serum creatinine Check c-Met today.  Subclinical hypothyroidism On Synthroid 75 mcg daily.  Doing well with this.  Check TSH.  Hyperglycemia Check A1c.  Dyslipidemia Check lipids.  [place] Stable on lialda per GI.  Had colonoscopy earlier this year with a couple polyps.  Will need repeat in 3 years.  GERD (gastroesophageal reflux disease) Stable.  Continue management per GI.  Preventative Healthcare: Check labs.  Up-to-date on vaccines and colonoscopy.  Patient Counseling(The following topics were reviewed and/or handout was given):  -Nutrition: Stressed importance of moderation in sodium/caffeine intake, saturated fat and cholesterol, caloric balance, sufficient intake of fresh fruits, vegetables, and fiber.  -Stressed the importance of regular exercise.   -Substance Abuse: Discussed cessation/primary prevention of tobacco, alcohol, or other drug use; driving or other dangerous activities under the influence; availability of treatment for abuse.   -Injury prevention: Discussed safety belts, safety helmets, smoke detector, smoking near bedding or upholstery.   -Sexuality: Discussed sexually transmitted diseases, partner selection, use of condoms, avoidance of unintended pregnancy and contraceptive alternatives.   -Dental health: Discussed importance of regular tooth brushing, flossing, and dental visits.  -Health maintenance and immunizations  reviewed. Please refer to Health maintenance section.  Return to care in 1 year for next preventative visit.     Subjective:  HPI:  He has no acute complaints today.  See A/P for status of chronic conditions.  Lifestyle Diet: Balanced.  Exercise: Does a lot of walking.      12/12/2021    7:42 AM  Depression screen PHQ 2/9  Decreased Interest 0  Down, Depressed, Hopeless 0  PHQ - 2 Score 0    Health Maintenance Due  Topic Date Due   COLONOSCOPY (Pts [phone] coverage will need to be confirmed)  05/06/2021   OPHTHALMOLOGY EXAM  05/28/2021     ROS: Per HPI, otherwise a complete review of systems was negative.   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 No pertinent family history.  Medications- reviewed and updated Current Outpatient Medications  Medication Sig Dispense Refill   brimonidine-timolol (COMBIGAN) 0.2-0.5 % ophthalmic solution Place 1 drop into both eyes every 12 (twelve) hours.     Garlic 5498 MG CAPS Take 1,000 mg by mouth daily.      glucosamine-chondroitin 500-400 MG tablet Take 1 tablet by mouth daily.     KRILL OIL PO Take 520 mg by mouth daily.     latanoprost (XALATAN) 0.005 % ophthalmic solution Place 1 drop into both eyes at bedtime.     levothyroxine (SYNTHROID) 75 MCG tablet TAKE 1 TABLET BY MOUTH EVERY DAY 90 tablet 0   lisinopril (ZESTRIL) 20 MG tablet Take 1 tablet (20 mg total) by mouth daily. 90 tablet 3   mesalamine (LIALDA) 1.2 g EC tablet Take 2.4 g by mouth daily with breakfast.     Multiple Vitamins-Minerals (PRESERVISION AREDS PO) Take 1 tablet by mouth daily.     niacin 500 MG tablet Take 500 mg by mouth 3 (three) times a week.     Plant Sterols and Stanols 450 MG TABS Take 900 mg by mouth in the morning and at bedtime.     Red Yeast Rice Extract (RED YEAST RICE PO) Take 1,200 mg by mouth daily.     Turmeric 500 MG CAPS Take 500 mg by mouth 2 (two) times a week.     No current facility-administered medications for this visit.    Allergies-reviewed and updated Allergies  Allergen Reactions   Penicillins     Family history only   Strawberry Extract Hives and Itching   Codeine Nausea Only    Social History   Socioeconomic History   Marital status: Married    Spouse name: Not on file   Number of children: Not on file   Years of education: Not on file   Highest education level: Not on file  Occupational History   Not on file  Tobacco Use   Smoking status: Former   Smokeless tobacco: Never  Vaping Use   Vaping Use: Never used  Substance and Sexual Activity   Alcohol use: Yes    Comment: Occasional   Drug use: No   Sexual activity: Not on file  Other Topics Concern   Not on file  Social History Narrative   Not on file   Social Determinants of Health   Financial Resource Strain: Not on file  Food Insecurity: Not on file  Transportation Needs: Not on file  Physical Activity: Not on file  Stress: Not on file  Social Connections: Not on file        Objective:  Physical  Exam: BP (!) 153/95   Pulse 72   Temp 97.6 F (36.4 C) (Temporal)   Ht 6' (1.829 m)   Wt 199 lb (90.3 kg)   SpO2 98%   BMI 26.99 kg/m   Body mass index is 26.99 kg/m. Wt Readings from Last 3 Encounters:  12/12/21 199 lb (90.3 kg)  12/08/20 199 lb 12.8 oz (90.6 kg)  10/07/20 190 lb 12.8 oz (86.5 kg)   Gen: NAD, resting comfortably HEENT: TMs normal bilaterally. OP clear. No thyromegaly noted.  CV: RRR with no murmurs appreciated Pulm: NWOB, CTAB with no crackles, wheezes, or rhonchi GI: Normal bowel sounds present. Soft, Nontender, Nondistended. MSK: no edema, cyanosis, or clubbing noted Skin: warm, dry Neuro: CN2-12 grossly intact. Strength 5/5 in upper and lower extremities. Reflexes symmetric and intact bilaterally.  Psych: Normal affect and thought content     Jourdin Connors M. Jerline Pain, MD 12/12/2021 8:22 AM

## 2021-12-12 NOTE — Assessment & Plan Note (Signed)
Check c-Met today.

## 2021-12-12 NOTE — Assessment & Plan Note (Addendum)
Doing well status post TURP.  No longer follows with urology.

## 2021-12-12 NOTE — Assessment & Plan Note (Addendum)
Slightly elevated today. Nephrology wants goal BP to be less than 130/80. He has been above this at home. We will increase lisinopril to 76m daily.  He will continue to monitor at home and [place]

## 2021-12-12 NOTE — Assessment & Plan Note (Signed)
Check A1c. 

## 2021-12-12 NOTE — Assessment & Plan Note (Signed)
On Synthroid 75 mcg daily.  Doing well with this.  Check TSH.

## 2021-12-12 NOTE — Patient Instructions (Signed)
It was very nice to see you today!  Please increase your lisinopril to 20 mg daily.  Keep an eye on your blood pressure at home and let me know how it is looking in a few weeks.  We will check blood work today.  Please continue work on diet and exercise.  We will see back in year for your next physical.  Come back sooner if needed.  Take care, Dr Jerline Pain  PLEASE NOTE:  If you had any lab tests please let us know if you have not heard back within a few days. You may see your results on mychart before we have a chance to review them but we will give you a call once they are reviewed by [place]. If we ordered any referrals today, please let us know if you have not heard from their office within the next week.   Please try these tips to maintain a healthy lifestyle:  Eat at least 3 REAL meals and 1-2 snacks per day.  Aim for no more than 5 hours between eating.  If you eat breakfast, please do so within one hour of getting up.   Each meal should contain half fruits/vegetables, one quarter protein, and one quarter carbs (no bigger than a computer mouse)  Cut down on sweet beverages. This includes juice, soda, and sweet tea.   Drink at least 1 glass of water with each meal and aim for at least 8 glasses per day  Exercise at least 150 minutes every week.    Preventive Care 46 Years and Older, Male Preventive care refers to lifestyle choices and visits with your health care provider that can promote health and wellness. Preventive care visits are also called wellness exams. What can I expect for my preventive care visit? Counseling During your preventive care visit, your health care provider may ask about your: Medical history, including: Past medical problems. Family medical history. History of falls. Current health, including: Emotional well-being. Home life and relationship well-being. Sexual activity. Memory and ability to understand (cognition). Lifestyle, including: Alcohol,  nicotine or tobacco, and drug use. Access to firearms. Diet, exercise, and sleep habits. Work and work Statistician. Sunscreen use. Safety issues such as seatbelt and bike helmet use. Physical exam Your health care provider will check your: Height and weight. These may be used to calculate your BMI (body mass index). BMI is a measurement that tells if you are at a healthy weight. Waist circumference. This measures the distance around your waistline. This measurement also tells if you are at a healthy weight and may help predict your risk of certain diseases, such as type 2 diabetes and high blood pressure. Heart rate and blood pressure. Body temperature. Skin for abnormal spots. What immunizations do I need?  Vaccines are usually given at various ages, according to a schedule. Your health care provider will recommend vaccines for you based on your age, medical history, and lifestyle or other factors, such as travel or where you work. What tests do I need? Screening Your health care provider may recommend screening tests for certain conditions. This may include: Lipid and cholesterol levels. Diabetes screening. This is done by checking your blood sugar (glucose) after you have not eaten for a while (fasting). Hepatitis C test. Hepatitis B test. HIV (human immunodeficiency virus) test. STI (sexually transmitted infection) testing, if you are at risk. Lung cancer screening. Colorectal cancer screening. Prostate cancer screening. Abdominal aortic aneurysm (AAA) screening. You may need this if you are a current  or former smoker. Talk with your health care provider about your test results, treatment options, and if necessary, the need for more tests. Follow these instructions at home: Eating and drinking  Eat a diet that includes fresh fruits and vegetables, whole grains, lean protein, and low-fat dairy products. Limit your intake of foods with high amounts of sugar, saturated fats, and  salt. Take vitamin and mineral supplements as recommended by your health care provider. Do not drink alcohol if your health care provider tells you not to drink. If you drink alcohol: Limit how much you have to 0-2 drinks a day. Know how much alcohol is in your drink. In the [place] one drink equals one 12 oz bottle of beer (355 mL), one 5 oz glass of wine (148 mL), or one 1 oz glass of hard liquor (44 mL). Lifestyle Brush your teeth every morning and night with fluoride toothpaste. Floss one time each day. Exercise for at least 30 minutes 5 or more days each week. Do not use any products that contain nicotine or tobacco. These products include cigarettes, chewing tobacco, and vaping devices, such as e-cigarettes. If you need help quitting, ask your health care provider. Do not use drugs. If you are sexually active, practice safe sex. Use a condom or other form of protection to prevent STIs. Take aspirin only as told by your health care provider. Make sure that you understand how much to take and what form to take. Work with your health care provider to find out whether it is safe and beneficial for you to take aspirin daily. Ask your health care provider if you need to take a cholesterol-lowering medicine (statin). Find healthy ways to manage stress, such as: Meditation, yoga, or listening to music. Journaling. Talking to a trusted person. Spending time with friends and family. Safety Always wear your seat belt while driving or riding in a vehicle. Do not drive: If you have been drinking alcohol. Do not ride with someone who has been drinking. When you are tired or distracted. While texting. If you have been using any mind-altering substances or drugs. Wear a helmet and other protective equipment during sports activities. If you have firearms in your house, make sure you follow all gun safety procedures. Minimize exposure to UV radiation to reduce your risk of skin cancer. What's  next? Visit your health care provider once a year for an annual wellness visit. Ask your health care provider how often you should have your eyes and teeth checked. Stay up to date on all vaccines. This information is not intended to replace advice given to you by your health care provider. Make sure you discuss any questions you have with your health care provider. Document Revised: 09/15/2020 Document Reviewed: 09/15/2020 Elsevier Patient Education  [place].

## 2021-12-12 NOTE — Assessment & Plan Note (Signed)
Stable.  Continue management per GI.

## 2021-12-12 NOTE — Assessment & Plan Note (Addendum)
Stable on lialda per GI.  Had colonoscopy earlier this year with a couple polyps.  Will need repeat in 3 years.

## 2021-12-12 NOTE — Assessment & Plan Note (Signed)
Check lipids 

## 2021-12-15 NOTE — Progress Notes (Signed)
Please inform patient of the following:  Labs are all stable compared to last year.  Do not need to make any changes to treatment plan at this time.  He should continue to work on diet and exercise and we can recheck in a year.

## 2021-12-16 ENCOUNTER — Other Ambulatory Visit: Payer: Self-pay | Admitting: Family Medicine

## 2022-01-17 ENCOUNTER — Other Ambulatory Visit: Payer: Self-pay | Admitting: Family Medicine

## 2022-01-30 ENCOUNTER — Telehealth: Payer: Self-pay | Admitting: Family Medicine

## 2022-01-30 NOTE — Telephone Encounter (Signed)
  Has patient reached out to billing?    If yes, what did billing advise the patient?   To reach out to [facility]   Patient Name: Eleazar Kimmey MRN: [id number] [place] ID: [id number] DOB: [birth year] Date of Service: 12/12/21 Amount:  $591.00    Additional Comments:   -[employer] states they need it to be Wellness Visit to cover the visit. -[employer] states the visit and labs are not part of benefits.    Please advise patient that we do not handle billing in the practice.  We will submit this concern to billing leadership.  The patient will be followed up with either by Billing leadership or Billing Customer Service.

## 2022-02-22 ENCOUNTER — Other Ambulatory Visit: Payer: Self-pay | Admitting: Family Medicine

## 2022-02-28 ENCOUNTER — Ambulatory Visit: Payer: Medicare PPO

## 2022-04-08 ENCOUNTER — Other Ambulatory Visit: Payer: Self-pay | Admitting: Family Medicine

## 2022-04-17 ENCOUNTER — Other Ambulatory Visit: Payer: Self-pay | Admitting: Family Medicine

## 2022-05-30 ENCOUNTER — Ambulatory Visit (INDEPENDENT_AMBULATORY_CARE_PROVIDER_SITE_OTHER): Payer: Medicare PPO | Admitting: Family Medicine

## 2022-05-30 ENCOUNTER — Encounter: Payer: Self-pay | Admitting: Family Medicine

## 2022-05-30 VITALS — BP 135/83 | HR 85 | Temp 97.5°F | Ht 72.0 in | Wt 196.4 lb

## 2022-05-30 DIAGNOSIS — L509 Urticaria, unspecified: Secondary | ICD-10-CM | POA: Diagnosis not present

## 2022-05-30 DIAGNOSIS — H409 Unspecified glaucoma: Secondary | ICD-10-CM | POA: Diagnosis not present

## 2022-05-30 DIAGNOSIS — I10 Essential (primary) hypertension: Secondary | ICD-10-CM

## 2022-05-30 MED ORDER — PREDNISONE 20 MG PO TABS: 20.0000 mg | ORAL_TABLET | Freq: Every day | ORAL | 0 refills | Status: DC

## 2022-05-30 NOTE — Progress Notes (Signed)
   Benjamin Chambers. is a 72 y.o. male who presents today for an office visit.  Assessment/Plan:  New/Acute Problems: Urticaria  No red flags.  No signs of respiratory distress.  Unclear etiology though does have several potential causes.  He has not had much improvement with over-the-counter antihistamines.  Given 1 spray distribution would be reasonable to start low-dose prednisone at this point.  He does have a history of glaucoma that has been very well-controlled.  We did discuss that there is potential that the prednisone could increase his IOP and cause worsening glaucoma however at this point his symptoms are under normal.  He has done well with prednisone in the past 15 years ago.  He also does have upcoming eye appointment and he will discuss this with his eye doctor as well.  Will start prednisone 20 mg daily x 5 days.  He can also use over-the-counter Benadryl.  We did discuss referral to allergy however will defer for now as it would not significantly change management.  He will let us know if symptoms worsen or do not improve.  Chronic Problems Addressed Today: Hypertension Initially elevated though at goal on recheck.  Continue lisinopril 20 mg daily.  Glaucoma His IOP have been stable and well controlled.  We did discuss risk and benefits of starting prednisone and the potential for worsening IOP.  He would like to do a small dose of prednisone given his current rash.  He has done well with this previously.  He will follow-up with ophthalmology soon.     Subjective:  HPI:  Patient here with rash. Started yesterday. Initially located on his head and has spread to torso and extremities.  Thinks that he is having allergic reaction.  Recently started taking krill oil supplement a few days ago. He also took a different generic version of his mesalamine that he is concerned he got a version that may have dye that he is allergic to. He similar reaction in the past when coming in contact  with the strawberries.  He also recently had a food tray from the grocery store did not have any strawberries however it is possible that he may have been in contact.  No shortness of breath.  No wheezing.  Tried Benadryl and cortisone cream without much improvement.       Objective:  Physical Exam: BP 135/83   Pulse 85   Temp (!) 97.5 F (36.4 C) (Temporal)   Ht 6' (1.829 m)   Wt 196 lb 6.4 oz (89.1 kg)   SpO2 97%   BMI 26.64 kg/m   Gen: No acute distress, resting comfortably CV: Regular rate and rhythm with no murmurs appreciated Pulm: Normal work of breathing, clear to auscultation bilaterally with no crackles, wheezes, or rhonchi Skin: Diffuse urticarial rash on scalp and torso.  Neuro: Grossly normal, moves all extremities Psych: Normal affect and thought content      [name], MD 05/30/2022 3:37 PM

## 2022-05-30 NOTE — Patient Instructions (Signed)
It was very nice to see you today!  Please start the prednisone.  You can also take an antihistamine such as Benadryl.  Let us know if not improving.  Let us know if you have any side effects with the prednisone.  Take care, Dr Jerline Pain  PLEASE NOTE:  If you had any lab tests, please let us know if you have not heard back within a few days. You may see your results on mychart before we have a chance to review them but we will give you a call once they are reviewed by [place].   If we ordered any referrals today, please let us know if you have not heard from their office within the next week.   If you had any urgent prescriptions sent in today, please check with the pharmacy within an hour of our visit to make sure the prescription was transmitted appropriately.   Please try these tips to maintain a healthy lifestyle:  Eat at least 3 REAL meals and 1-2 snacks per day.  Aim for no more than 5 hours between eating.  If you eat breakfast, please do so within one hour of getting up.   Each meal should contain half fruits/vegetables, one quarter protein, and one quarter carbs (no bigger than a computer mouse)  Cut down on sweet beverages. This includes juice, soda, and sweet tea.   Drink at least 1 glass of water with each meal and aim for at least 8 glasses per day  Exercise at least 150 minutes every week.

## 2022-05-30 NOTE — Assessment & Plan Note (Signed)
His IOP have been stable and well controlled.  We did discuss risk and benefits of starting prednisone and the potential for worsening IOP.  He would like to do a small dose of prednisone given his current rash.  He has done well with this previously.  He will follow-up with ophthalmology soon.

## 2022-05-30 NOTE — Assessment & Plan Note (Signed)
Initially elevated though at goal on recheck.  Continue lisinopril 20 mg daily.

## 2022-06-05 ENCOUNTER — Encounter: Payer: Self-pay | Admitting: Family Medicine

## 2022-06-05 NOTE — Telephone Encounter (Signed)
I appreciate the update. I am glad he is feeling better! He should let us know if any new symptoms arise.  Algis Greenhouse. Jerline Pain, MD 06/05/2022 1:43 PM

## 2022-06-06 ENCOUNTER — Ambulatory Visit: Payer: Medicare PPO | Admitting: Family Medicine

## 2022-06-13 DIAGNOSIS — N1831 Chronic kidney disease, stage 3a: Secondary | ICD-10-CM | POA: Diagnosis not present

## 2022-06-13 DIAGNOSIS — N400 Enlarged prostate without lower urinary tract symptoms: Secondary | ICD-10-CM | POA: Diagnosis not present

## 2022-06-13 DIAGNOSIS — I129 Hypertensive chronic kidney disease with stage 1 through stage 4 chronic kidney disease, or unspecified chronic kidney disease: Secondary | ICD-10-CM | POA: Diagnosis not present

## 2022-06-14 LAB — LAB REPORT - SCANNED
Albumin, Urine POC: 6.9
Creatinine, POC: 20.2 mg/dL
EGFR: 69
Microalb Creat Ratio: 34

## 2022-09-29 ENCOUNTER — Other Ambulatory Visit: Payer: Self-pay | Admitting: Family Medicine

## 2022-09-29 DIAGNOSIS — H401131 Primary open-angle glaucoma, bilateral, mild stage: Secondary | ICD-10-CM | POA: Diagnosis not present

## 2022-09-29 DIAGNOSIS — H25813 Combined forms of age-related cataract, bilateral: Secondary | ICD-10-CM | POA: Diagnosis not present

## 2022-11-16 ENCOUNTER — Encounter (INDEPENDENT_AMBULATORY_CARE_PROVIDER_SITE_OTHER): Payer: Self-pay

## 2022-11-29 DIAGNOSIS — K5190 Ulcerative colitis, unspecified, without complications: Secondary | ICD-10-CM | POA: Diagnosis not present

## 2022-12-06 ENCOUNTER — Other Ambulatory Visit: Payer: Self-pay | Admitting: Family Medicine

## 2022-12-14 ENCOUNTER — Encounter: Payer: Self-pay | Admitting: Family Medicine

## 2022-12-14 ENCOUNTER — Ambulatory Visit (INDEPENDENT_AMBULATORY_CARE_PROVIDER_SITE_OTHER): Payer: Medicare PPO | Admitting: Family Medicine

## 2022-12-14 VITALS — BP 127/83 | HR 72 | Temp 98.2°F | Ht 72.0 in | Wt 198.6 lb

## 2022-12-14 DIAGNOSIS — N401 Enlarged prostate with lower urinary tract symptoms: Secondary | ICD-10-CM | POA: Diagnosis not present

## 2022-12-14 DIAGNOSIS — R7989 Other specified abnormal findings of blood chemistry: Secondary | ICD-10-CM

## 2022-12-14 DIAGNOSIS — E038 Other specified hypothyroidism: Secondary | ICD-10-CM

## 2022-12-14 DIAGNOSIS — E785 Hyperlipidemia, unspecified: Secondary | ICD-10-CM

## 2022-12-14 DIAGNOSIS — Z0000 Encounter for general adult medical examination without abnormal findings: Secondary | ICD-10-CM

## 2022-12-14 DIAGNOSIS — I10 Essential (primary) hypertension: Secondary | ICD-10-CM

## 2022-12-14 DIAGNOSIS — K51919 Ulcerative colitis, unspecified with unspecified complications: Secondary | ICD-10-CM | POA: Diagnosis not present

## 2022-12-14 DIAGNOSIS — N138 Other obstructive and reflux uropathy: Secondary | ICD-10-CM

## 2022-12-14 DIAGNOSIS — R739 Hyperglycemia, unspecified: Secondary | ICD-10-CM

## 2022-12-14 LAB — HEMOGLOBIN A1C: Hgb A1c MFr Bld: 5.7 % (ref 4.6–6.5)

## 2022-12-14 LAB — LIPID PANEL
Cholesterol: 185 mg/dL (ref 0–200)
HDL: 37.6 mg/dL — ABNORMAL LOW (ref >39.00)
LDL Cholesterol: 109 mg/dL — ABNORMAL HIGH (ref 0–99)
NonHDL: 147.48
Total CHOL/HDL Ratio: 5
Triglycerides: 192 mg/dL — ABNORMAL HIGH (ref 0.0–149.0)
VLDL: 38.4 mg/dL (ref 0.0–40.0)

## 2022-12-14 LAB — TSH: TSH: 1.82 u[IU]/mL (ref 0.35–5.50)

## 2022-12-14 LAB — PSA: PSA: 1.17 ng/mL (ref 0.10–4.00)

## 2022-12-14 NOTE — Patient Instructions (Signed)
It was very nice to see you today!  We will check blood work today.   Please continue to work on diet and exercise.   Return in about 1 year (around 12/14/2023) for Annual Physical.   Take care, Dr Jimmey Ralph  PLEASE NOTE:  If you had any lab tests, please let us know if you have not heard back within a few days. You may see your results on mychart before we have a chance to review them but we will give you a call once they are reviewed by [place].   If we ordered any referrals today, please let us know if you have not heard from their office within the next week.   If you had any urgent prescriptions sent in today, please check with the pharmacy within an hour of our visit to make sure the prescription was transmitted appropriately.   Please try these tips to maintain a healthy lifestyle:  Eat at least 3 REAL meals and 1-2 snacks per day.  Aim for no more than 5 hours between eating.  If you eat breakfast, please do so within one hour of getting up.   Each meal should contain half fruits/vegetables, one quarter protein, and one quarter carbs (no bigger than a computer mouse)  Cut down on sweet beverages. This includes juice, soda, and sweet tea.   Drink at least 1 glass of water with each meal and aim for at least 8 glasses per day  Exercise at least 150 minutes every week.    Preventive Care 16 Years and Older, Male Preventive care refers to lifestyle choices and visits with your health care provider that can promote health and wellness. Preventive care visits are also called wellness exams. What can I expect for my preventive care visit? Counseling During your preventive care visit, your health care provider may ask about your: Medical history, including: Past medical problems. Family medical history. History of falls. Current health, including: Emotional well-being. Home life and relationship well-being. Sexual activity. Memory and ability to understand (cognition). Lifestyle,  including: Alcohol, nicotine or tobacco, and drug use. Access to firearms. Diet, exercise, and sleep habits. Work and work Astronomer. Sunscreen use. Safety issues such as seatbelt and bike helmet use. Physical exam Your health care provider will check your: Height and weight. These may be used to calculate your BMI (body mass index). BMI is a measurement that tells if you are at a healthy weight. Waist circumference. This measures the distance around your waistline. This measurement also tells if you are at a healthy weight and may help predict your risk of certain diseases, such as type 2 diabetes and high blood pressure. Heart rate and blood pressure. Body temperature. Skin for abnormal spots. What immunizations do I need?  Vaccines are usually given at various ages, according to a schedule. Your health care provider will recommend vaccines for you based on your age, medical history, and lifestyle or other factors, such as travel or where you work. What tests do I need? Screening Your health care provider may recommend screening tests for certain conditions. This may include: Lipid and cholesterol levels. Diabetes screening. This is done by checking your blood sugar (glucose) after you have not eaten for a while (fasting). Hepatitis C test. Hepatitis B test. HIV (human immunodeficiency virus) test. STI (sexually transmitted infection) testing, if you are at risk. Lung cancer screening. Colorectal cancer screening. Prostate cancer screening. Abdominal aortic aneurysm (AAA) screening. You may need this if you are a current or  former smoker. Talk with your health care provider about your test results, treatment options, and if necessary, the need for more tests. Follow these instructions at home: Eating and drinking  Eat a diet that includes fresh fruits and vegetables, whole grains, lean protein, and low-fat dairy products. Limit your intake of foods with high amounts of sugar,  saturated fats, and salt. Take vitamin and mineral supplements as recommended by your health care provider. Do not drink alcohol if your health care provider tells you not to drink. If you drink alcohol: Limit how much you have to 0-2 drinks a day. Know how much alcohol is in your drink. In the [place] one drink equals one 12 oz bottle of beer (355 mL), one 5 oz glass of wine (148 mL), or one 1 oz glass of hard liquor (44 mL). Lifestyle Brush your teeth every morning and night with fluoride toothpaste. Floss one time each day. Exercise for at least 30 minutes 5 or more days each week. Do not use any products that contain nicotine or tobacco. These products include cigarettes, chewing tobacco, and vaping devices, such as e-cigarettes. If you need help quitting, ask your health care provider. Do not use drugs. If you are sexually active, practice safe sex. Use a condom or other form of protection to prevent STIs. Take aspirin only as told by your health care provider. Make sure that you understand how much to take and what form to take. Work with your health care provider to find out whether it is safe and beneficial for you to take aspirin daily. Ask your health care provider if you need to take a cholesterol-lowering medicine (statin). Find healthy ways to manage stress, such as: Meditation, yoga, or listening to music. Journaling. Talking to a trusted person. Spending time with friends and family. Safety Always wear your seat belt while driving or riding in a vehicle. Do not drive: If you have been drinking alcohol. Do not ride with someone who has been drinking. When you are tired or distracted. While texting. If you have been using any mind-altering substances or drugs. Wear a helmet and other protective equipment during sports activities. If you have firearms in your house, make sure you follow all gun safety procedures. Minimize exposure to UV radiation to reduce your risk of skin  cancer. What's next? Visit your health care provider once a year for an annual wellness visit. Ask your health care provider how often you should have your eyes and teeth checked. Stay up to date on all vaccines. This information is not intended to replace advice given to you by your health care provider. Make sure you discuss any questions you have with your health care provider. Document Revised: 09/15/2020 Document Reviewed: 09/15/2020 Elsevier Patient Education  2024 [employer].

## 2022-12-14 NOTE — Assessment & Plan Note (Signed)
At goal today on lisinopril 20 mg daily.

## 2022-12-14 NOTE — Progress Notes (Signed)
Chief Complaint:  Benjamin Chambers. is a 72 y.o. male who presents today for his annual comprehensive physical exam.    Assessment/Plan:  Chronic Problems Addressed Today: Dyslipidemia Check lipids. Discussed lifestyle modifications.   Hyperglycemia Check A1c. Discussed lifestyle modifications.   Subclinical hypothyroidism On levothyroxine daily. Check TSH.   Ulcerative colitis [facility]) Following with gastroenterology recently had labs done which where normal. Uses mesalamine 2.6g daily.   Elevated serum creatinine Following with nephrology. Recent creatinine within normal limits.   Hypertension At goal today on lisinopril 20 mg daily.   BPH s/p TURP 06/2020 Doing well status post TURP. Check PSA today.   Preventative Healthcare: Check labs.  Will get flu and COVID shot soon.  Up-to-date on colonoscopy.  Patient Counseling(The following topics were reviewed and/or handout was given):  -Nutrition: Stressed importance of moderation in sodium/caffeine intake, saturated fat and cholesterol, caloric balance, sufficient intake of fresh fruits, vegetables, and fiber.  -Stressed the importance of regular exercise.   -Substance Abuse: Discussed cessation/primary prevention of tobacco, alcohol, or other drug use; driving or other dangerous activities under the influence; availability of treatment for abuse.   -Injury prevention: Discussed safety belts, safety helmets, smoke detector, smoking near bedding or upholstery.   -Sexuality: Discussed sexually transmitted diseases, partner selection, use of condoms, avoidance of unintended pregnancy and contraceptive alternatives.   -Dental health: Discussed importance of regular tooth brushing, flossing, and dental visits.  -Health maintenance and immunizations reviewed. Please refer to Health maintenance section.  Return to care in 1 year for next preventative visit.     Subjective:  HPI:  He has no acute complaints today. See  Assessment / plan for status of chronic conditions.   Lifestyle Diet: Balanced.  Exercise: Plays tennis. Does a lot of walking.      12/14/2022    7:43 AM  Depression screen PHQ 2/9  Decreased Interest 0  Down, Depressed, Hopeless 0  PHQ - 2 Score 0    Health Maintenance Due  Topic Date Due   Medicare Annual Wellness (AWV)  08/09/2017   OPHTHALMOLOGY EXAM  05/28/2021     ROS: Per HPI, otherwise a complete review of systems was negative.   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 No pertinent family history.  Medications- reviewed and updated Current Outpatient Medications  Medication Sig Dispense Refill   brimonidine-timolol (COMBIGAN) 0.2-0.5 % ophthalmic solution Place 1 drop into both eyes every 12 (twelve) hours.     Garlic  1000 MG CAPS Take 1,000 mg by mouth daily.     glucosamine-chondroitin 500-400 MG tablet Take 1 tablet by mouth daily.     KRILL OIL PO Take 520 mg by mouth daily.     latanoprost (XALATAN) 0.005 % ophthalmic solution Place 1 drop into both eyes at bedtime.      levothyroxine (SYNTHROID) 75 MCG tablet TAKE 1 TABLET BY MOUTH EVERY DAY 90 tablet 0   lisinopril (ZESTRIL) 20 MG tablet TAKE 1 TABLET BY MOUTH EVERY DAY 90 tablet 3   mesalamine (LIALDA) 1.2 g EC tablet Take 2.4 g by mouth daily with breakfast.     Multiple Vitamins-Minerals (PRESERVISION AREDS PO) Take 1 tablet by mouth daily.     niacin 500 MG tablet Take 500 mg by mouth 3 (three) times a week.     Plant Sterols and Stanols 450 MG TABS Take 900 mg by mouth in the morning and at bedtime.     Red Yeast Rice Extract (RED YEAST RICE PO) Take 1,200 mg by mouth daily.     No current facility-administered medications for this visit.    Allergies-reviewed and updated Allergies  Allergen Reactions   Penicillins     Family history only   Strawberry Extract Hives and Itching   Codeine Nausea Only    Social History   Socioeconomic History   Marital status: Married    Spouse name: Not on file   Number of children: Not on file   Years of education: Not on file   Highest education level: Not on file  Occupational History   Not on file  Tobacco Use   Smoking status: Former   Smokeless tobacco: Never  Vaping Use   Vaping status: Never Used  Substance and Sexual Activity   Alcohol use: Yes    Comment: Occasional   Drug use: No   Sexual activity: Not on file  Other Topics Concern   Not on file  Social History Narrative   Not on file   Social Determinants of Health   Financial Resource Strain: Low Risk  (11/26/2022)   Received from [facility]   Overall Financial Resource Strain (CARDIA)    Difficulty of Paying Living Expenses: Not hard at all  Food Insecurity: No Food Insecurity (11/26/2022)   Received from [facility]   Hunger Vital Sign    Worried About Running Out of Food in the Last Year: Never true    Ran Out of Food in the Last Year: Never true  Transportation Needs: No Transportation Needs (11/26/2022)   Received [facility] - Transportation     Lack of Transportation (Medical): No    Lack of Transportation (Non-Medical): No  Physical Activity: Sufficiently Active (11/26/2022)   Received from [facility]   Exercise Vital Sign    Days of Exercise per Week: 5 days    Minutes of Exercise per Session: 60 min  Stress: No Stress Concern Present (11/26/2022)   Received from [facility] - Occupational Stress Questionnaire    Feeling of Stress : Only a little  Social Connections: Moderately Integrated (11/26/2022)   Received from [facility]   Social Network    How would you rate your social network (family, work, friends)?: Adequate participation with social networks        Objective:  Physical Exam: BP 127/83   Pulse 72   Temp 98.2 F (36.8 C) (Temporal)   Ht 6' (1.829  m)   Wt 198 lb 9.6 oz (90.1 kg)   SpO2 98%   BMI 26.94 kg/m   Body mass index is 26.94 kg/m. Wt Readings from Last 3 Encounters:  12/14/22 198 lb 9.6 oz (90.1 kg)  05/30/22 196 lb 6.4 oz (89.1 kg)  12/12/21 199 lb (90.3 kg)   Gen: NAD, resting comfortably HEENT: TMs normal bilaterally. OP clear. No thyromegaly noted.  CV: RRR with no murmurs appreciated Pulm: NWOB, CTAB with no crackles, wheezes, or rhonchi GI: Normal bowel sounds present. Soft, Nontender, Nondistended. MSK: no edema, cyanosis, or clubbing noted Skin: warm, dry Neuro: CN2-12 grossly intact. Strength 5/5 in upper and lower extremities. Reflexes symmetric and intact bilaterally.  Psych: Normal affect and thought content     Benjamin Stefan M. Jimmey Ralph, MD 12/14/2022 8:08 AM

## 2022-12-14 NOTE — Assessment & Plan Note (Signed)
On levothyroxine daily. Check TSH.

## 2022-12-14 NOTE — Assessment & Plan Note (Signed)
Following with gastroenterology recently had labs done which where normal. Uses mesalamine 2.6g daily.

## 2022-12-14 NOTE — Assessment & Plan Note (Signed)
Doing well status post TURP. Check PSA today.

## 2022-12-14 NOTE — Assessment & Plan Note (Signed)
Check lipids. Discussed lifestyle modifications.  

## 2022-12-14 NOTE — Assessment & Plan Note (Signed)
Check A1c.  Discussed lifestyle modifications. °

## 2022-12-14 NOTE — Assessment & Plan Note (Signed)
Following with nephrology. Recent creatinine within normal limits.

## 2022-12-15 NOTE — Progress Notes (Signed)
Cholesterol is borderline elevated.  Probably would be a good idea to get him back on a statin to improve his numbers and lower risk of heart attack and stroke.  Please send in simvastatin 40 mg daily if he is agreeable to start.  The rest of his labs are all stable.  Do not need to make any other changes to treatment plan at this time.  He should continue to work on diet and exercise and we can recheck everything in a year or so.

## 2022-12-20 ENCOUNTER — Other Ambulatory Visit: Payer: Self-pay | Admitting: *Deleted

## 2022-12-20 MED ORDER — LEVOTHYROXINE SODIUM 75 MCG PO TABS: 75.0000 ug | ORAL_TABLET | Freq: Every day | ORAL | 0 refills | Status: DC

## 2022-12-26 ENCOUNTER — Ambulatory Visit (INDEPENDENT_AMBULATORY_CARE_PROVIDER_SITE_OTHER): Payer: Medicare PPO

## 2022-12-26 VITALS — Wt 196.0 lb

## 2022-12-26 DIAGNOSIS — Z0000 Encounter for general adult medical examination without abnormal findings: Secondary | ICD-10-CM

## 2022-12-26 NOTE — Progress Notes (Signed)
Subjective:   Benjamin Parrow. is a 72 y.o. male who presents for an Initial Medicare Annual Wellness Visit.  Visit Complete: Virtual  I connected with  Benjamin Chambers. on 12/26/22 by a audio enabled telemedicine application and verified that I am speaking with the correct person using two identifiers.  Patient Location: Home  Provider Location: Home Office  I discussed the limitations of evaluation and management by telemedicine. The patient expressed understanding and agreed to proceed.  Patient Medicare AWV questionnaire was completed by the patient on 12/22/22; I have confirmed that all information answered by patient is correct and no changes since this date.Vital Signs: Unable to obtain new vitals due to this being a telehealth visit.   Cardiac Risk Factors include: advanced age (>59men, >43 women);dyslipidemia;male gender;hypertension     Objective:    Today's Vitals   12/26/22 0829  Weight: 196 lb (88.9 kg)   Body mass index is 26.58 kg/m.     12/26/2022    8:35 AM 06/18/2020    9:00 PM 06/08/2020    8:44 AM 06/28/2017    3:57 PM  Advanced Directives  Does Patient Have a Medical Advance Directive? No No No No  Would patient like information on creating a medical advance directive? No - Patient declined No - Patient declined  No - Patient declined    Current Medications (verified) Outpatient Encounter Medications as of 12/26/2022  Medication Sig   brimonidine-timolol (COMBIGAN) 0.2-0.5 % ophthalmic solution Place 1 drop into both eyes every 12 (twelve) hours.   Garlic 1000 MG CAPS Take 1,000 mg by mouth daily.   glucosamine-chondroitin 500-400 MG tablet Take 1 tablet by mouth daily.   KRILL OIL PO Take 520 mg by mouth daily.   latanoprost (XALATAN) 0.005 % ophthalmic solution Place 1 drop into both eyes at bedtime.   levothyroxine (SYNTHROID) 75 MCG tablet Take 1 tablet (75 mcg total) by mouth daily.   lisinopril (ZESTRIL) 20 MG tablet TAKE 1 TABLET BY MOUTH  EVERY DAY   mesalamine (LIALDA) 1.2 g EC tablet Take 2.4 g by mouth daily with breakfast.   Multiple Vitamins-Minerals (PRESERVISION AREDS PO) Take 1 tablet by mouth daily.   niacin 500 MG tablet Take 500 mg by mouth 3 (three) times a week.   Plant Sterols and Stanols 450 MG TABS Take 900 mg by mouth in the morning and at bedtime.   Red Yeast Rice Extract (RED YEAST RICE PO) Take 1,200 mg by mouth daily.   No facility-administered encounter medications on file as of 12/26/2022.    Allergies (verified) Penicillins, Strawberry extract, and Codeine   History: Past Medical History:  Diagnosis Date   Allergy    Anemia    Cancer (HCC)    basal and squamus cells   GERD (gastroesophageal reflux disease)    Hypertension    Hypothyroidism    Osteoarthritis    Ulcerative colitis (HCC)    Past Surgical History:  Procedure Laterality Date   BASAL CELL CARCINOMA EXCISION  2011   HERNIA REPAIR  2018   SQUAMOUS CELL CARCINOMA EXCISION  2015   History reviewed. No pertinent family history. Social History   Socioeconomic History   Marital status: Married    Spouse name: Not on file   Number of children: Not on file   Years of education: Not on file   Highest education level: Not on file  Occupational History   Not on file  Tobacco Use   Smoking status:  Former   Smokeless tobacco: Never  Vaping Use   Vaping status: Never Used  Substance and Sexual Activity   Alcohol use: Yes    Comment: Occasional   Drug use: No   Sexual activity: Not on file  Other Topics Concern   Not on file  Social History Narrative   Not on file   Social Determinants of Health   Financial Resource Strain: Low Risk  (12/22/2022)   Overall Financial Resource Strain (CARDIA)    Difficulty of Paying Living Expenses: Not hard at all  Food Insecurity: No Food Insecurity (12/22/2022)   Hunger Vital Sign    Worried About Running Out of Food in the Last Year: Never true    Ran Out of Food in the Last Year:  Never true  Transportation Needs: No Transportation Needs (12/22/2022)   PRAPARE - Administrator, Civil Service (Medical): No    Lack of Transportation (Non-Medical): No  Physical Activity: Sufficiently Active (12/22/2022)   Exercise Vital Sign    Days of Exercise per Week: 5 days    Minutes of Exercise per Session: 40 min  Stress: No Stress Concern Present (12/22/2022)   [employer] of Occupational Health - Occupational Stress Questionnaire    Feeling of Stress : Only a little  Social Connections: Moderately Isolated (12/22/2022)   Social Connection and Isolation Panel [NHANES]    Frequency of Communication with Friends and Family: Once a week    Frequency of Social Gatherings with Friends and Family: Once a week    Attends Religious Services: 1 to 4 times per year    Active Member of [employer] or Organizations: No    Attends Engineer, structural: Patient declined    Marital Status: Married    Tobacco Counseling Counseling given: Not Answered   Clinical Intake:  Pre-visit preparation completed: Yes  Pain : No/denies pain     BMI - recorded: 26.58 Nutritional Status: BMI 25 -29 Overweight Nutritional Risks: None Diabetes: No  How often do you need to have someone help you when you read instructions, pamphlets, or other written materials from your doctor or pharmacy?: 1 - Never  Interpreter Needed?: No  Information entered by :: Benjamin Ensign, LPN   Activities of Daily Living    12/22/2022    9:46 AM  In your present state of health, do you have any difficulty performing the following activities:  Hearing? 0  Vision? 0  Difficulty concentrating or making decisions? 0  Walking or climbing stairs? 0  Dressing or bathing? 0  Doing errands, shopping? 0  Preparing Food and eating ? N  Using the Toilet? N  In the past six months, have you accidently leaked urine? N  Do you have problems with loss of bowel control? N  Managing your Medications?  N  Managing your Finances? N  Housekeeping or managing your Housekeeping? N    Patient Care Team: Ardith Dark, MD as PCP - General (Family Medicine) Marney Setting, MD as Referring Physician (Gastroenterology) Jamey Ripa, OD as Physician Assistant (Optometry)  Indicate any recent Medical Services you may have received from other than [facility] providers in the past year (date may be approximate).     Assessment:   This is a routine wellness examination for Benjamin Chambers.  Hearing/Vision screen Vision Screening - Comments:: Pt follows up with [facility] for annual eye exams    Goals Addressed             This  Visit's Progress    Patient Stated       Continue to lose weight and increase exercise        Depression Screen    12/26/2022    8:36 AM 12/14/2022    7:43 AM 05/30/2022    2:37 PM 12/12/2021    7:42 AM 12/08/2020    9:34 AM 10/07/2020    1:01 PM 12/02/2019    1:18 PM  PHQ 2/9 Scores  PHQ - 2 Score 0 0 0 0 0 0 0    Fall Risk    12/22/2022    9:46 AM 12/14/2022    7:43 AM 05/30/2022    2:37 PM 12/12/2021    7:42 AM 12/08/2020    9:34 AM  Fall Risk   Falls in the past year? 0 0 0 0 0  Number falls in past yr: 0 0 0 0 0  Injury with Fall? 0 0 0 0 0  Risk for fall due to : Impaired vision No Fall Risks No Fall Risks No Fall Risks   Follow up Falls prevention discussed        MEDICARE RISK AT HOME: Medicare Risk at Home Any stairs in or around the home?: No If so, are there any without handrails?: No Home free of loose throw rugs in walkways, pet beds, electrical cords, etc?: Yes Adequate lighting in your home to reduce risk of falls?: Yes Life alert?: No Use of a cane, walker or w/c?: No Grab bars in the bathroom?: No Shower chair or bench in shower?: No Elevated toilet seat or a handicapped toilet?: No  TIMED UP AND GO:  Was the test performed? No    Cognitive Function:        12/26/2022    8:37 AM  6CIT Screen  What Year? 0 points  What month?  0 points  What time? 0 points  Count back from 20 0 points  Months in reverse 0 points  Repeat phrase 0 points  Total Score 0 points    Immunizations Immunization History  Administered Date(s) Administered   Fluad Quad(high Dose 65+) 01/03/2019, 01/07/2021   Influenza, High Dose Seasonal PF 02/27/2011, 03/11/2012, 12/31/2012, 12/25/2013, 01/27/2016, 02/09/2017, 12/27/2017, 01/09/2020, 12/30/2021   Influenza, Seasonal, Injecte, Preservative Fre 12/25/2013   Moderna Covid-19 Fall Seasonal Vaccine 41yrs & older 08/03/2022   Moderna Covid-19 Vaccine Bivalent Booster 55yrs & up 12/31/2020   PFIZER Comirnaty(Gray Top)Covid-19 Tri-Sucrose Vaccine 07/30/2020   PFIZER(Purple Top)SARS-COV-2 Vaccination 05/18/2019, 06/10/2019, 01/20/2020   Pneumococcal Conjugate-13 02/09/2017, 01/03/2019   Pneumococcal Polysaccharide-23 12/27/2017   Pneumococcal-Unspecified 12/27/2017   Respiratory Syncytial Virus Vaccine,Recomb Aduvanted(Arexvy) 12/23/2021   Tdap 04/03/2008, 09/09/2019   Unspecified SARS-COV-2 Vaccination 12/30/2021   Zoster Recombinant(Shingrix) 09/10/2020, 11/12/2020    TDAP status: Up to date  Flu Vaccine status: Due, Education has been provided regarding the importance of this vaccine. Advised may receive this vaccine at local pharmacy or Health Dept. Aware to provide a copy of the vaccination record if obtained from local pharmacy or Health Dept. Verbalized acceptance and understanding.  Pneumococcal vaccine status: Up to date  Covid-19 vaccine status: Information provided on how to obtain vaccines.   Qualifies for Shingles Vaccine? Yes   Zostavax completed Yes   Shingrix Completed?: Yes  Screening Tests Health Maintenance  Topic Date Due   OPHTHALMOLOGY EXAM  05/28/2021   INFLUENZA VACCINE  07/02/2023 (Originally 11/02/2022)   Medicare Annual Wellness (AWV)  12/26/2023   Colonoscopy  09/26/2024   DTaP/Tdap/Td (3 - Td or  Tdap) 09/08/2029   Pneumonia Vaccine 60+ Years old   Completed   COVID-19 Vaccine  Completed   Hepatitis C Screening  Completed   Zoster Vaccines- Shingrix  Completed   HPV VACCINES  Aged Out    Health Maintenance  Health Maintenance Due  Topic Date Due   OPHTHALMOLOGY EXAM  05/28/2021    Colorectal cancer screening: Type of screening: Colonoscopy. Completed 09/26/21. Repeat every 3 years  Lung Cancer Screening: (Low Dose CT Chest recommended if Age 35-80 years, 20 pack-year currently smoking OR have quit w/in 15years.) does not qualify.    Additional Screening:  Hepatitis C Screening:  Completed 11/30/15  Vision Screening: Recommended annual ophthalmology exams for early detection of glaucoma and other disorders of the eye. Is the patient up to date with their annual eye exam?  Yes  Who is the provider or what is the name of the office in which the patient attends annual eye exams? [facility] Dr Jamey Ripa  If pt is not established with a provider, would they like to be referred to a provider to establish care? No .   Dental Screening: Recommended annual dental exams for proper oral hygiene   Community Resource Referral / Chronic Care Management: CRR required this visit?  No   CCM required this visit?  No    Plan:     I have personally reviewed and noted the following in the patient's chart:   Medical and social history Use of alcohol, tobacco or illicit drugs  Current medications and supplements including opioid prescriptions. Patient is not currently taking opioid prescriptions. Functional ability and status Nutritional status Physical activity Advanced directives List of other physicians Hospitalizations, surgeries, and ER visits in previous 12 months Vitals Screenings to include cognitive, depression, and falls Referrals and appointments  In addition, I have reviewed and discussed with patient certain preventive protocols, quality metrics, and best practice recommendations. A written personalized  care plan for preventive services as well as general preventive health recommendations were provided to patient.     Marzella Schlein, LPN   1/[id number]/[id number]   After Visit Summary: (MyChart) Due to this being a telephonic visit, the after visit summary with patients personalized plan was offered to patient via MyChart   Nurse Notes: none

## 2022-12-26 NOTE — Patient Instructions (Signed)
Mr. Rutigliano , Thank you for taking time to come for your Medicare Wellness Visit. I appreciate your ongoing commitment to your health goals. Please review the following plan we discussed and let me know if I can assist you in the future.   Referrals/Orders/Follow-Ups/Clinician Recommendations: continue to lose weight and increase exercise   This is a list of the screening recommended for you and due dates:  Health Maintenance  Topic Date Due   Eye exam for diabetics  05/28/2021   COVID-19 Vaccine (8 - 2023-24 season) 12/30/2022*   Flu Shot  07/02/2023*   Medicare Annual Wellness Visit  12/26/2023   Colon Cancer Screening  09/26/2024   DTaP/Tdap/Td vaccine (3 - Td or Tdap) 09/08/2029   Pneumonia Vaccine  Completed   Hepatitis C Screening  Completed   Zoster (Shingles) Vaccine  Completed   HPV Vaccine  Aged Out  *Topic was postponed. The date shown is not the original due date.    Advanced directives: (Declined) Advance directive discussed with you today. Even though you declined this today, please call our office should you change your mind, and we can give you the proper paperwork for you to fill out.  Next Medicare Annual Wellness Visit scheduled for next year: Yes

## 2022-12-30 ENCOUNTER — Other Ambulatory Visit: Payer: Self-pay | Admitting: Family Medicine

## 2023-01-22 DIAGNOSIS — R229 Localized swelling, mass and lump, unspecified: Secondary | ICD-10-CM | POA: Diagnosis not present

## 2023-01-22 DIAGNOSIS — C44722 Squamous cell carcinoma of skin of right lower limb, including hip: Secondary | ICD-10-CM | POA: Diagnosis not present

## 2023-01-22 DIAGNOSIS — L578 Other skin changes due to chronic exposure to nonionizing radiation: Secondary | ICD-10-CM | POA: Diagnosis not present

## 2023-01-22 DIAGNOSIS — Z08 Encounter for follow-up examination after completed treatment for malignant neoplasm: Secondary | ICD-10-CM | POA: Diagnosis not present

## 2023-01-22 DIAGNOSIS — B078 Other viral warts: Secondary | ICD-10-CM | POA: Diagnosis not present

## 2023-01-22 DIAGNOSIS — L820 Inflamed seborrheic keratosis: Secondary | ICD-10-CM | POA: Diagnosis not present

## 2023-01-22 DIAGNOSIS — Z85828 Personal history of other malignant neoplasm of skin: Secondary | ICD-10-CM | POA: Diagnosis not present

## 2023-01-22 DIAGNOSIS — L570 Actinic keratosis: Secondary | ICD-10-CM | POA: Diagnosis not present

## 2023-02-06 DIAGNOSIS — H31011 Macula scars of posterior pole (postinflammatory) (post-traumatic), right eye: Secondary | ICD-10-CM | POA: Diagnosis not present

## 2023-02-06 DIAGNOSIS — H401131 Primary open-angle glaucoma, bilateral, mild stage: Secondary | ICD-10-CM | POA: Diagnosis not present

## 2023-02-06 DIAGNOSIS — H25813 Combined forms of age-related cataract, bilateral: Secondary | ICD-10-CM | POA: Diagnosis not present

## 2023-02-15 ENCOUNTER — Encounter: Payer: Self-pay | Admitting: Family Medicine

## 2023-02-15 NOTE — Telephone Encounter (Signed)
Care team updated and letter sent for eye exam notes.

## 2023-05-26 ENCOUNTER — Encounter: Payer: Self-pay | Admitting: Family Medicine

## 2023-05-28 ENCOUNTER — Other Ambulatory Visit: Payer: Self-pay | Admitting: *Deleted

## 2023-05-28 MED ORDER — LEVOTHYROXINE SODIUM 75 MCG PO TABS: 75.0000 ug | ORAL_TABLET | Freq: Every day | ORAL | 0 refills | Status: DC

## 2023-06-04 DIAGNOSIS — H25813 Combined forms of age-related cataract, bilateral: Secondary | ICD-10-CM | POA: Diagnosis not present

## 2023-06-04 DIAGNOSIS — H401131 Primary open-angle glaucoma, bilateral, mild stage: Secondary | ICD-10-CM | POA: Diagnosis not present

## 2023-06-04 DIAGNOSIS — H31011 Macula scars of posterior pole (postinflammatory) (post-traumatic), right eye: Secondary | ICD-10-CM | POA: Diagnosis not present

## 2023-06-14 DIAGNOSIS — I129 Hypertensive chronic kidney disease with stage 1 through stage 4 chronic kidney disease, or unspecified chronic kidney disease: Secondary | ICD-10-CM | POA: Diagnosis not present

## 2023-06-14 DIAGNOSIS — N1831 Chronic kidney disease, stage 3a: Secondary | ICD-10-CM | POA: Diagnosis not present

## 2023-06-14 DIAGNOSIS — N400 Enlarged prostate without lower urinary tract symptoms: Secondary | ICD-10-CM | POA: Diagnosis not present

## 2023-06-14 LAB — CBC AND DIFFERENTIAL
HCT: 46 (ref 41–53)
Hemoglobin: 15.9 (ref 13.5–17.5)
Neutrophils Absolute: 4
Platelets: 262 10*3/uL (ref 150–400)
WBC: 8.7

## 2023-06-14 LAB — BASIC METABOLIC PANEL
BUN: 19 (ref 4–21)
CO2: 28 — AB (ref 13–22)
Chloride: 102 (ref 99–108)
Creatinine: 1.2 (ref 0.6–1.3)
Glucose: 99
Potassium: 4.5 meq/L (ref 3.5–5.1)
Sodium: 138 (ref 137–147)

## 2023-06-14 LAB — COMPREHENSIVE METABOLIC PANEL
Albumin: 4.7 (ref 3.5–5.0)
Calcium: 10.1 (ref 8.7–10.7)

## 2023-06-14 LAB — PROTEIN / CREATININE RATIO, URINE: Creatinine, Urine: 15.6

## 2023-06-14 LAB — CBC: RBC: 5.22 — AB (ref 3.87–5.11)

## 2023-06-15 LAB — LAB REPORT - SCANNED
Albumin, Urine POC: 6.9
Albumin/Creatinine Ratio, Urine, POC: 44
Creatinine, POC: 15.6 mg/dL
EGFR: 64

## 2023-06-22 ENCOUNTER — Encounter: Payer: Self-pay | Admitting: *Deleted

## 2023-07-11 DIAGNOSIS — I129 Hypertensive chronic kidney disease with stage 1 through stage 4 chronic kidney disease, or unspecified chronic kidney disease: Secondary | ICD-10-CM | POA: Diagnosis not present

## 2023-07-25 DIAGNOSIS — Z85828 Personal history of other malignant neoplasm of skin: Secondary | ICD-10-CM | POA: Diagnosis not present

## 2023-07-25 DIAGNOSIS — L821 Other seborrheic keratosis: Secondary | ICD-10-CM | POA: Diagnosis not present

## 2023-07-25 DIAGNOSIS — L820 Inflamed seborrheic keratosis: Secondary | ICD-10-CM | POA: Diagnosis not present

## 2023-07-25 DIAGNOSIS — L2989 Other pruritus: Secondary | ICD-10-CM | POA: Diagnosis not present

## 2023-07-25 DIAGNOSIS — L814 Other melanin hyperpigmentation: Secondary | ICD-10-CM | POA: Diagnosis not present

## 2023-07-25 DIAGNOSIS — C44722 Squamous cell carcinoma of skin of right lower limb, including hip: Secondary | ICD-10-CM | POA: Diagnosis not present

## 2023-07-25 DIAGNOSIS — Z08 Encounter for follow-up examination after completed treatment for malignant neoplasm: Secondary | ICD-10-CM | POA: Diagnosis not present

## 2023-07-25 DIAGNOSIS — D225 Melanocytic nevi of trunk: Secondary | ICD-10-CM | POA: Diagnosis not present

## 2023-07-25 DIAGNOSIS — R229 Localized swelling, mass and lump, unspecified: Secondary | ICD-10-CM | POA: Diagnosis not present

## 2023-08-24 ENCOUNTER — Other Ambulatory Visit: Payer: Self-pay | Admitting: Family Medicine

## 2023-10-04 DIAGNOSIS — H25813 Combined forms of age-related cataract, bilateral: Secondary | ICD-10-CM | POA: Diagnosis not present

## 2023-10-04 DIAGNOSIS — H1045 Other chronic allergic conjunctivitis: Secondary | ICD-10-CM | POA: Diagnosis not present

## 2023-10-04 DIAGNOSIS — H401131 Primary open-angle glaucoma, bilateral, mild stage: Secondary | ICD-10-CM | POA: Diagnosis not present

## 2023-10-04 DIAGNOSIS — H47393 Other disorders of optic disc, bilateral: Secondary | ICD-10-CM | POA: Diagnosis not present

## 2023-11-19 ENCOUNTER — Other Ambulatory Visit: Payer: Self-pay | Admitting: Family Medicine

## 2023-11-24 ENCOUNTER — Other Ambulatory Visit: Payer: Self-pay | Admitting: Family Medicine

## 2023-12-18 ENCOUNTER — Ambulatory Visit: Payer: Medicare PPO | Admitting: Family Medicine

## 2023-12-18 ENCOUNTER — Encounter: Payer: Self-pay | Admitting: Family Medicine

## 2023-12-18 VITALS — BP 161/101 | HR 66 | Temp 97.5°F | Ht 72.0 in | Wt 201.8 lb

## 2023-12-18 DIAGNOSIS — E785 Hyperlipidemia, unspecified: Secondary | ICD-10-CM | POA: Diagnosis not present

## 2023-12-18 DIAGNOSIS — I10 Essential (primary) hypertension: Secondary | ICD-10-CM

## 2023-12-18 DIAGNOSIS — R351 Nocturia: Secondary | ICD-10-CM

## 2023-12-18 DIAGNOSIS — E038 Other specified hypothyroidism: Secondary | ICD-10-CM

## 2023-12-18 DIAGNOSIS — K51919 Ulcerative colitis, unspecified with unspecified complications: Secondary | ICD-10-CM

## 2023-12-18 DIAGNOSIS — Z0001 Encounter for general adult medical examination with abnormal findings: Secondary | ICD-10-CM

## 2023-12-18 DIAGNOSIS — Z13220 Encounter for screening for lipoid disorders: Secondary | ICD-10-CM

## 2023-12-18 DIAGNOSIS — R739 Hyperglycemia, unspecified: Secondary | ICD-10-CM

## 2023-12-18 DIAGNOSIS — R7989 Other specified abnormal findings of blood chemistry: Secondary | ICD-10-CM

## 2023-12-18 LAB — COMPREHENSIVE METABOLIC PANEL WITH GFR
ALT: 21 U/L (ref 0–53)
AST: 23 U/L (ref 0–37)
Albumin: 4.4 g/dL (ref 3.5–5.2)
Alkaline Phosphatase: 65 U/L (ref 39–117)
BUN: 12 mg/dL (ref 6–23)
CO2: 26 meq/L (ref 19–32)
Calcium: 9.4 mg/dL (ref 8.4–10.5)
Chloride: 104 meq/L (ref 96–112)
Creatinine, Ser: 1.18 mg/dL (ref 0.40–1.50)
GFR: 61.4 mL/min (ref >60.00)
Glucose, Bld: 104 mg/dL — ABNORMAL HIGH (ref 70–99)
Potassium: 4.3 meq/L (ref 3.5–5.1)
Sodium: 140 meq/L (ref 135–145)
Total Bilirubin: 0.5 mg/dL (ref 0.2–1.2)
Total Protein: 7.1 g/dL (ref 6.0–8.3)

## 2023-12-18 LAB — CBC
HCT: 45.3 % (ref 39.0–52.0)
Hemoglobin: 15.5 g/dL (ref 13.0–17.0)
MCHC: 34.2 g/dL (ref 30.0–36.0)
MCV: 88.3 fl (ref 78.0–100.0)
Platelets: 245 K/uL (ref 150.0–400.0)
RBC: 5.13 Mil/uL (ref 4.22–5.81)
RDW: 13.9 % (ref 11.5–15.5)
WBC: 6.9 K/uL (ref 4.0–10.5)

## 2023-12-18 LAB — LIPID PANEL
Cholesterol: 183 mg/dL (ref 0–200)
HDL: 38.2 mg/dL — ABNORMAL LOW (ref >39.00)
LDL Cholesterol: 108 mg/dL — ABNORMAL HIGH (ref 0–99)
NonHDL: 144.98
Total CHOL/HDL Ratio: 5
Triglycerides: 186 mg/dL — ABNORMAL HIGH (ref 0.0–149.0)
VLDL: 37.2 mg/dL (ref 0.0–40.0)

## 2023-12-18 LAB — TSH: TSH: 2.92 u[IU]/mL (ref 0.35–5.50)

## 2023-12-18 LAB — PSA: PSA: 1.33 ng/mL (ref 0.10–4.00)

## 2023-12-18 LAB — HEMOGLOBIN A1C: Hgb A1c MFr Bld: 6 % (ref 4.6–6.5)

## 2023-12-18 NOTE — Assessment & Plan Note (Signed)
Check A1c with labs. 

## 2023-12-18 NOTE — Progress Notes (Signed)
 Chief Complaint:  Benjamin Chambers. is a 73 y.o. male who presents today for his annual comprehensive physical exam.    Assessment/Plan:  New/Acute Problems: Grief  Patient is going through grieving process with his sons recent passing.  He does have good support in place.  We discussed medications or referral to counseling however he would like to hold off on this for now.  He will let us  know if he needs any further assistance.  Chronic Problems Addressed Today: No problem-specific Assessment & Plan notes found for this encounter.   Preventative Healthcare: Check labs.  Will be getting flu and COVID-vaccine soon.  Up-to-date on colon cancer screening.  Patient Counseling(The following topics were reviewed and/or handout was given):  -Nutrition: Stressed importance of moderation in sodium/caffeine intake, saturated fat and cholesterol, caloric balance, sufficient intake of fresh fruits, vegetables, and fiber.  -Stressed the importance of regular exercise.   -Substance Abuse: Discussed cessation/primary prevention of tobacco, alcohol, or other drug use; driving or other dangerous activities under the influence; availability of treatment for abuse.   -Injury prevention: Discussed safety belts, safety helmets, smoke detector, smoking near bedding or upholstery.   -Sexuality: Discussed sexually transmitted diseases, partner selection, use of condoms, avoidance of unintended pregnancy and contraceptive alternatives.   -Dental health: Discussed importance of regular tooth brushing, flossing, and dental visits.  -Health maintenance and immunizations reviewed. Please refer to Health maintenance section.  Return to care in 1 year for next preventative visit.     Subjective:  HPI:  He has no acute complaints today. Patient is here today for his  annual physical.  See assessment / plan for status of chronic conditions.  Discussed the use of AI scribe software for clinical note  transcription with the patient, who gave verbal consent to proceed.  History of Present Illness Benjamin Chambers is a 73 year old male who presents for an annual physical exam.  He is experiencing grief and depression following the recent suicide of his son one month ago. This event has been emotionally challenging, and he is still processing the loss, experiencing periods of denial. He has support from family and friends and is engaging in activities such as playing guitar, teaching part-time, and participating in a [employer] group called '[employer]' to help process his grief. He also practices Zen meditation to manage his mental health.  He is trying to maintain his physical health by eating right and exercising. He mentions a history of prostate issues that have been corrected and follows up with a nephrologist annually due to slight kidney damage. He has experienced issues with blood pressure monitoring due to a faulty machine, but recent checks have been normal.  He plans to get his COVID and flu vaccines soon, having faced some scheduling difficulties.        12/26/2022    8:36 AM  Depression screen PHQ 2/9  Decreased Interest 0  Down, Depressed, Hopeless 0  PHQ - 2 Score 0    Health Maintenance Due  Topic Date Due   OPHTHALMOLOGY EXAM  05/28/2021   Medicare Annual Wellness (AWV)  12/26/2023     ROS: Per HPI, otherwise a complete review of systems was negative.   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 No pertinent family history.  Medications- reviewed and updated Current Outpatient Medications  Medication Sig Dispense Refill   brimonidine -timolol  (COMBIGAN ) 0.2-0.5 % ophthalmic solution Place 1 drop into both eyes every 12 (twelve) hours.     Garlic 1000 MG CAPS Take 1,000 mg by mouth daily.     glucosamine-chondroitin 500-400 MG tablet Take 1 tablet by mouth daily.     KRILL OIL PO Take 520 mg by mouth daily.     latanoprost  (XALATAN ) 0.005 % ophthalmic solution Place 1 drop into both eyes at bedtime.     levothyroxine  (SYNTHROID ) 75 MCG tablet TAKE 1 TABLET BY MOUTH EVERY DAY 90 tablet 0   lisinopril  (ZESTRIL ) 20 MG tablet TAKE 1 TABLET BY MOUTH EVERY DAY 30 tablet 0   mesalamine  (LIALDA ) 1.2 g EC tablet Take 2.4 g by mouth daily with breakfast.     Multiple Vitamins-Minerals (PRESERVISION AREDS PO) Take 1 tablet by mouth daily.     Plant Sterols and Stanols 450 MG TABS Take 900 mg by mouth in the morning and at bedtime.     No current facility-administered medications for this visit.    Allergies-reviewed and updated Allergies  Allergen Reactions   Penicillins     Family history only   Strawberry Extract Hives and Itching   Codeine Nausea Only    Social History   Socioeconomic History   Marital status: Married    Spouse name: Not on file   Number of children: Not on file   Years of education: Not on file   Highest education level: Not on file  Occupational History   Not on file  Tobacco Use   Smoking status: Former   Smokeless  tobacco: Never  Vaping Use   Vaping status: Never Used  Substance and Sexual Activity   Alcohol use: Yes    Comment: Occasional   Drug use: No   Sexual activity: Not on file  Other Topics Concern   Not on file  Social History Narrative   Not on file   Social Drivers of Health   Financial Resource Strain: Low Risk  (12/22/2022)   Overall Financial Resource Strain (CARDIA)    Difficulty of Paying Living Expenses: Not hard at all  Food Insecurity: No Food Insecurity (12/22/2022)   Hunger Vital Sign    Worried About Running Out of Food in the Last Year: Never true    Ran Out of Food in the Last Year: Never true  Transportation Needs: No Transportation Needs (12/22/2022)   PRAPARE - Administrator, Civil Service (Medical): No    Lack of Transportation (Non-Medical): No  Physical Activity: Sufficiently Active (12/22/2022)   Exercise Vital Sign  Days of Exercise per Week: 5 days    Minutes of Exercise per Session: 40 min  Stress: No Stress Concern Present (12/22/2022)   [employer] of Occupational Health - Occupational Stress Questionnaire    Feeling of Stress : Only a little  Social Connections: Moderately Isolated (12/22/2022)   Social Connection and Isolation Panel    Frequency of Communication with Friends and Family: Once a week    Frequency of Social Gatherings with Friends and Family: Once a week    Attends Religious Services: 1 to 4 times per year    Active Member of [employer] or Organizations: No    Attends Engineer, structural: Patient declined    Marital Status: Married        Objective:  Physical Exam: BP (!) 160/97   Pulse 66   Temp (!) 97.5 F (36.4 C) (Temporal)   Ht 6' (1.829 m)   Wt 201 lb 12.8 oz (91.5 kg)   SpO2 98%   BMI 27.37 kg/m   Body mass index is 27.37 kg/m. Wt Readings from Last 3 Encounters:  12/18/23 201 lb 12.8 oz (91.5 kg)  12/26/22 196 lb (88.9 kg)  12/14/22 198 lb 9.6 oz (90.1 kg)   Gen: NAD, resting  comfortably HEENT: TMs normal bilaterally. OP clear. No thyromegaly noted.  CV: RRR with no murmurs appreciated Pulm: NWOB, CTAB with no crackles, wheezes, or rhonchi GI: Normal bowel sounds present. Soft, Nontender, Nondistended. MSK: no edema, cyanosis, or clubbing noted Skin: warm, dry Neuro: CN2-12 grossly intact. Strength 5/5 in upper and lower extremities. Reflexes symmetric and intact bilaterally.  Psych: Normal affect and thought content     Harleen Fineberg M. Kennyth, MD 12/18/2023 8:20 AM

## 2023-12-18 NOTE — Assessment & Plan Note (Signed)
 Follows with GI.  He is on mesalamine  2.4 g daily.

## 2023-12-18 NOTE — Assessment & Plan Note (Signed)
 Follows with nephrology. Check labs.

## 2023-12-18 NOTE — Assessment & Plan Note (Signed)
 Check lipids. Discussed lifestyle modifications.

## 2023-12-18 NOTE — Assessment & Plan Note (Signed)
 Check TSH

## 2023-12-18 NOTE — Assessment & Plan Note (Signed)
 Elevated today.  Was at goal last year.  Has been under quite a bit of stress recently.  He will continue lisinopril  20 mg daily and monitor at home and let us  know if persistently elevated.

## 2023-12-18 NOTE — Patient Instructions (Signed)
 It was very nice to see you today!  VISIT SUMMARY: Today, you came in for your annual physical exam. We discussed your recent grief and depression following the loss of your son, your elevated blood pressure, and your plans for upcoming vaccinations.  YOUR PLAN: ADULT WELLNESS VISIT: Routine visit revealed elevated blood pressure, likely due to stressors. A physical exam will be performed, and blood work ordered. -We will perform a physical exam and order blood work today. -Your blood pressure will be reassessed before you leave. -Your colonoscopy is scheduled for next year.  HYPERTENSION: Your blood pressure is elevated, likely due to stress. Previous issues with home monitoring have been resolved, but you are not currently monitoring at home. -Encourage periodic home blood pressure monitoring, ideally once a week or a couple of times a month.  GENERAL HEALTH MAINTENANCE: The importance of vaccinations was discussed. -You plan to receive flu and COVID-19 vaccines in October. -Attempt to schedule COVID-19 and flu vaccines for October. -Consider visiting the med center for the COVID-19 vaccine if not available elsewhere.  Return in about 1 year (around 12/17/2024) for Annual Physical.   Take care, Dr Kennyth  PLEASE NOTE:  If you had any lab tests, please let us  know if you have not heard back within a few days. You may see your results on mychart before we have a chance to review them but we will give you a call once they are reviewed by us .   If we ordered any referrals today, please let us  know if you have not heard from their office within the next week.   If you had any urgent prescriptions sent in today, please check with the pharmacy within an hour of our visit to make sure the prescription was transmitted appropriately.   Please try these tips to maintain a healthy lifestyle:  Eat at least 3 REAL meals and 1-2 snacks per day.  Aim for no more than 5 hours between eating.  If  you eat breakfast, please do so within one hour of getting up.   Each meal should contain half fruits/vegetables, one quarter protein, and one quarter carbs (no bigger than a computer mouse)  Cut down on sweet beverages. This includes juice, soda, and sweet tea.   Drink at least 1 glass of water with each meal and aim for at least 8 glasses per day  Exercise at least 150 minutes every week.    Preventive Care 92 Years and Older, Male Preventive care refers to lifestyle choices and visits with your health care provider that can promote health and wellness. Preventive care visits are also called wellness exams. What can I expect for my preventive care visit? Counseling During your preventive care visit, your health care provider may ask about your: Medical history, including: Past medical problems. Family medical history. History of falls. Current health, including: Emotional well-being. Home life and relationship well-being. Sexual activity. Memory and ability to understand (cognition). Lifestyle, including: Alcohol, nicotine or tobacco, and drug use. Access to firearms. Diet, exercise, and sleep habits. Work and work Astronomer. Sunscreen use. Safety issues such as seatbelt and bike helmet use. Physical exam Your health care provider will check your: Height and weight. These may be used to calculate your BMI (body mass index). BMI is a measurement that tells if you are at a healthy weight. Waist circumference. This measures the distance around your waistline. This measurement also tells if you are at a healthy weight and may help predict your risk  of certain diseases, such as type 2 diabetes and high blood pressure. Heart rate and blood pressure. Body temperature. Skin for abnormal spots. What immunizations do I need?  Vaccines are usually given at various ages, according to a schedule. Your health care provider will recommend vaccines for you based on your age, medical  history, and lifestyle or other factors, such as travel or where you work. What tests do I need? Screening Your health care provider may recommend screening tests for certain conditions. This may include: Lipid and cholesterol levels. Diabetes screening. This is done by checking your blood sugar (glucose) after you have not eaten for a while (fasting). Hepatitis C test. Hepatitis B test. HIV (human immunodeficiency virus) test. STI (sexually transmitted infection) testing, if you are at risk. Lung cancer screening. Colorectal cancer screening. Prostate cancer screening. Abdominal aortic aneurysm (AAA) screening. You may need this if you are a current or former smoker. Talk with your health care provider about your test results, treatment options, and if necessary, the need for more tests. Follow these instructions at home: Eating and drinking  Eat a diet that includes fresh fruits and vegetables, whole grains, lean protein, and low-fat dairy products. Limit your intake of foods with high amounts of sugar, saturated fats, and salt. Take vitamin and mineral supplements as recommended by your health care provider. Do not drink alcohol if your health care provider tells you not to drink. If you drink alcohol: Limit how much you have to 0-2 drinks a day. Know how much alcohol is in your drink. In the [place] one drink equals one 12 oz bottle of beer (355 mL), one 5 oz glass of wine (148 mL), or one 1 oz glass of hard liquor (44 mL). Lifestyle Brush your teeth every morning and night with fluoride toothpaste. Floss one time each day. Exercise for at least 30 minutes 5 or more days each week. Do not use any products that contain nicotine or tobacco. These products include cigarettes, chewing tobacco, and vaping devices, such as e-cigarettes. If you need help quitting, ask your health care provider. Do not use drugs. If you are sexually active, practice safe sex. Use a condom or other form of  protection to prevent STIs. Take aspirin only as told by your health care provider. Make sure that you understand how much to take and what form to take. Work with your health care provider to find out whether it is safe and beneficial for you to take aspirin daily. Ask your health care provider if you need to take a cholesterol-lowering medicine (statin). Find healthy ways to manage stress, such as: Meditation, yoga, or listening to music. Journaling. Talking to a trusted person. Spending time with friends and family. Safety Always wear your seat belt while driving or riding in a vehicle. Do not drive: If you have been drinking alcohol. Do not ride with someone who has been drinking. When you are tired or distracted. While texting. If you have been using any mind-altering substances or drugs. Wear a helmet and other protective equipment during sports activities. If you have firearms in your house, make sure you follow all gun safety procedures. Minimize exposure to UV radiation to reduce your risk of skin cancer. What's next? Visit your health care provider once a year for an annual wellness visit. Ask your health care provider how often you should have your eyes and teeth checked. Stay up to date on all vaccines. This information is not intended to replace advice given  to you by your health care provider. Make sure you discuss any questions you have with your health care provider. Document Revised: 09/15/2020 Document Reviewed: 09/15/2020 Elsevier Patient Education  2024 [employer].

## 2023-12-20 ENCOUNTER — Other Ambulatory Visit: Payer: Self-pay | Admitting: Family Medicine

## 2023-12-20 ENCOUNTER — Ambulatory Visit: Payer: Self-pay | Admitting: Family Medicine

## 2023-12-20 NOTE — Progress Notes (Signed)
 His cholesterol and A1c are borderline elevated but similar to last year.  All of his other labs are at goal.  Do not need to make any changes to his treatment plan.  He should continue to work on diet and exercise and we can recheck everything in a year or so.

## 2023-12-31 ENCOUNTER — Ambulatory Visit: Payer: Medicare PPO

## 2024-02-08 DIAGNOSIS — H31011 Macula scars of posterior pole (postinflammatory) (post-traumatic), right eye: Secondary | ICD-10-CM | POA: Diagnosis not present

## 2024-02-08 DIAGNOSIS — H401131 Primary open-angle glaucoma, bilateral, mild stage: Secondary | ICD-10-CM | POA: Diagnosis not present

## 2024-02-08 DIAGNOSIS — H25813 Combined forms of age-related cataract, bilateral: Secondary | ICD-10-CM | POA: Diagnosis not present

## 2024-02-08 DIAGNOSIS — H47393 Other disorders of optic disc, bilateral: Secondary | ICD-10-CM | POA: Diagnosis not present

## 2024-02-16 ENCOUNTER — Other Ambulatory Visit: Payer: Self-pay | Admitting: Family Medicine

## 2024-03-04 ENCOUNTER — Ambulatory Visit

## 2024-03-04 VITALS — Ht 72.0 in | Wt 198.0 lb

## 2024-03-04 DIAGNOSIS — Z0000 Encounter for general adult medical examination without abnormal findings: Secondary | ICD-10-CM | POA: Diagnosis not present

## 2024-03-04 NOTE — Progress Notes (Addendum)
 Chief Complaint  Patient presents with   Medicare Wellness     Subjective:   Benjamin Chambers. is a 73 y.o. male who presents for a Medicare Annual Wellness Visit.  Visit info / Clinical Intake: Medicare Wellness Visit Type:: Subsequent Annual Wellness Visit Persons participating in visit and providing information:: patient Medicare Wellness Visit Mode:: Telephone If telephone:: video declined Since this visit was completed virtually, some vitals may be partially provided or unavailable. Missing vitals are due to the limitations of the virtual format.: Unable to obtain vitals - no equipment If Telephone or Video please confirm:: I connected with patient using audio/video enable telemedicine. I verified patient identity with two identifiers, discussed telehealth limitations, and patient agreed to proceed. Patient Location:: home Provider Location:: home office Interpreter Needed?: No Pre-visit prep was completed: yes AWV questionnaire completed by patient prior to visit?: yes Date:: 03/03/24 Living arrangements:: (Patient-Rptd) lives with spouse/significant other Patient's Overall Health Status Rating: (Patient-Rptd) excellent Typical amount of pain: (Patient-Rptd) none Does pain affect daily life?: (Patient-Rptd) no Are you currently prescribed opioids?: no  Dietary Habits and Nutritional Risks How many meals a day?: (Patient-Rptd) 3 Eats fruit and vegetables daily?: (Patient-Rptd) yes Most meals are obtained by: (Patient-Rptd) preparing own meals In the last 2 weeks, have you had any of the following?: none Diabetic:: no  Functional Status Activities of Daily Living (to include ambulation/medication): (Patient-Rptd) Independent Ambulation: Independent with device- listed below Home Assistive Devices/Equipment: Eyeglasses Medication Administration: (Patient-Rptd) Independent Home Management (perform basic housework or laundry): (Patient-Rptd) Independent Manage your own  finances?: (Patient-Rptd) yes Primary transportation is: (Patient-Rptd) driving Concerns about vision?: no *vision screening is required for WTM* Concerns about hearing?: no  Fall Screening Falls in the past year?: (Patient-Rptd) 0 Number of falls in past year: 0 Was there an injury with Fall?: 0 Fall Risk Category Calculator: 0 Patient Fall Risk Level: Low Fall Risk  Fall Risk Patient at Risk for Falls Due to: No Fall Risks Fall risk Follow up: Falls prevention discussed  Home and Transportation Safety: All rugs have non-skid backing?: (Patient-Rptd) yes All stairs or steps have railings?: (Patient-Rptd) yes Grab bars in the bathtub or shower?: (Patient-Rptd) yes Have non-skid surface in bathtub or shower?: (Patient-Rptd) yes Good home lighting?: (Patient-Rptd) yes Regular seat belt use?: (Patient-Rptd) yes Hospital stays in the last year:: (Patient-Rptd) no  Cognitive Assessment Difficulty concentrating, remembering, or making decisions? : (Patient-Rptd) no Will 6CIT or Mini Cog be Completed: no 6CIT or Mini Cog Declined: patient alert, oriented, able to answer questions appropriately and recall recent events  Advance Directives (For Healthcare) Does Patient Have a Medical Advance Directive?: No Would patient like information on creating a medical advance directive?: No - Patient declined  Reviewed/Updated  Reviewed/Updated: Reviewed All (Medical, Surgical, Family, Medications, Allergies, Care Teams, Patient Goals)    Allergies (verified) Penicillins, Strawberry extract, and Codeine   Current Medications (verified) Outpatient Encounter Medications as of 03/04/2024  Medication Sig   brimonidine -timolol  (COMBIGAN ) 0.2-0.5 % ophthalmic solution Place 1 drop into both eyes every 12 (twelve) hours.   COMIRNATY syringe    Garlic 1000 MG CAPS Take 1,000 mg by mouth daily.   glucosamine-chondroitin 500-400 MG tablet Take 1 tablet by mouth daily.   KRILL OIL PO Take 520 mg by  mouth daily.   latanoprost  (XALATAN ) 0.005 % ophthalmic solution Place 1 drop into both eyes at bedtime.   levothyroxine  (SYNTHROID ) 75 MCG tablet TAKE 1 TABLET BY MOUTH EVERY DAY   lisinopril  (ZESTRIL )  20 MG tablet TAKE 1 TABLET BY MOUTH EVERY DAY   mesalamine  (LIALDA ) 1.2 g EC tablet Take 2.4 g by mouth daily with breakfast.   Multiple Vitamins-Minerals (PRESERVISION AREDS PO) Take 1 tablet by mouth daily.   Plant Sterols and Stanols 450 MG TABS Take 900 mg by mouth in the morning and at bedtime.   No facility-administered encounter medications on file as of 03/04/2024.    History: Past Medical History:  Diagnosis Date   Allergy    Anemia    Cancer (HCC)    basal and squamus cells   GERD (gastroesophageal reflux disease)    Hypertension    Hypothyroidism    Osteoarthritis    Ulcerative colitis (HCC)    Past Surgical History:  Procedure Laterality Date   BASAL CELL CARCINOMA EXCISION  2011   HERNIA REPAIR  2018   SQUAMOUS CELL CARCINOMA EXCISION  2015   History reviewed. No pertinent family history. Social History   Occupational History   Not on file  Tobacco Use   Smoking status: Former   Smokeless tobacco: Never  Vaping Use   Vaping status: Never Used  Substance and Sexual Activity   Alcohol use: Yes    Comment: Occasional   Drug use: No   Sexual activity: Not on file   Tobacco Counseling Counseling given: Not Answered  SDOH Screenings   Food Insecurity: No Food Insecurity (03/03/2024)  Housing: Low Risk  (03/03/2024)  Transportation Needs: No Transportation Needs (03/03/2024)  Utilities: Not At Risk (03/04/2024)  Alcohol Screen: Low Risk  (12/22/2022)  Depression (PHQ2-9): Low Risk  (03/04/2024)  Financial Resource Strain: Low Risk  (03/03/2024)  Physical Activity: Sufficiently Active (03/03/2024)  Social Connections: Moderately Integrated (03/03/2024)  Stress: Stress Concern Present (03/03/2024)  Tobacco Use: Medium Risk (03/04/2024)  Health Literacy: Adequate  Health Literacy (03/04/2024)   See flowsheets for full screening details  Depression Screen PHQ 2 & 9 Depression Scale- Over the past 2 weeks, how often have you been bothered by any of the following problems? Little interest or pleasure in doing things: 0 Feeling down, depressed, or hopeless (PHQ Adolescent also includes...irritable): 0 PHQ-2 Total Score: 0     Goals Addressed               This Visit's Progress     lose weight (pt-stated)        Lose weight              Objective:    Today's Vitals   03/04/24 1535  Weight: 198 lb (89.8 kg)  Height: 6' (1.829 m)   Body mass index is 26.85 kg/m.  Hearing/Vision screen Hearing Screening - Comments:: Pt denies any hearing issues  Vision Screening - Comments:: Wears rx glasses - up to date with routine eye exams with Dr Maritza  Immunizations and Health Maintenance Health Maintenance  Topic Date Due   OPHTHALMOLOGY EXAM  08/06/2023   COVID-19 Vaccine (9 - Pfizer risk 2025-26 season) 07/11/2024   Colonoscopy  09/26/2024   Medicare Annual Wellness (AWV)  03/04/2025   DTaP/Tdap/Td (3 - Td or Tdap) 09/08/2029   Pneumococcal Vaccine: 50+ Years  Completed   Influenza Vaccine  Completed   Hepatitis C Screening  Completed   Zoster Vaccines- Shingrix  Completed   Meningococcal B Vaccine  Aged Out        Assessment/Plan:  This is a routine wellness examination for Benjamin Chambers.  Patient Care Team: Kennyth Worth HERO, MD as PCP - General (Family Medicine)  Zulema Gear, MD as Referring Physician (Gastroenterology) Maritza Lamp, OD as Physician Assistant (Optometry)  I have personally reviewed and noted the following in the patient's chart:   Medical and social history Use of alcohol, tobacco or illicit drugs  Current medications and supplements including opioid prescriptions. Functional ability and status Nutritional status Physical activity Advanced directives List of other physicians Hospitalizations, surgeries,  and ER visits in previous 12 months Vitals Screenings to include cognitive, depression, and falls Referrals and appointments  No orders of the defined types were placed in this encounter.  In addition, I have reviewed and discussed with patient certain preventive protocols, quality metrics, and best practice recommendations. A written personalized care plan for preventive services as well as general preventive health recommendations were provided to patient.   Benjamin VEAR Haws, LPN   [id number]/[id number]   Return in 1 year (on 03/09/2025).  After Visit Summary: (MyChart) Due to this being a telephonic visit, the after visit summary with patients personalized plan was offered to patient via MyChart   Nurse Notes: none

## 2024-03-04 NOTE — Patient Instructions (Signed)
 Mr. Benjamin Chambers,  Thank you for taking the time for your Medicare Wellness Visit. I appreciate your continued commitment to your health goals. Please review the care plan we discussed, and feel free to reach out if I can assist you further.  Please note that Annual Wellness Visits do not include a physical exam. Some assessments may be limited, especially if the visit was conducted virtually. If needed, we may recommend an in-person follow-up with your provider.  Ongoing Care Seeing your primary care provider every 3 to 6 months helps us  monitor your health and provide consistent, personalized care.   Referrals If a referral was made during today's visit and you haven't received any updates within two weeks, please contact the referred provider directly to check on the status.  Recommended Screenings:  Health Maintenance  Topic Date Due   Eye exam for diabetics  08/06/2023   COVID-19 Vaccine (9 - Pfizer risk 2025-26 season) 07/11/2024   Colon Cancer Screening  09/26/2024   Medicare Annual Wellness Visit  03/04/2025   DTaP/Tdap/Td vaccine (3 - Td or Tdap) 09/08/2029   Pneumococcal Vaccine for age over 67  Completed   Flu Shot  Completed   Hepatitis C Screening  Completed   Zoster (Shingles) Vaccine  Completed   Meningitis B Vaccine  Aged Out       03/03/2024    6:44 PM  Advanced Directives  Does Patient Have a Medical Advance Directive? No  Would patient like information on creating a medical advance directive? No - Patient declined    Vision: Annual vision screenings are recommended for early detection of glaucoma, cataracts, and diabetic retinopathy. These exams can also reveal signs of chronic conditions such as diabetes and high blood pressure.  Dental: Annual dental screenings help detect early signs of oral cancer, gum disease, and other conditions linked to overall health, including heart disease and diabetes.  Please see the attached documents for additional preventive care  recommendations.

## 2024-05-09 ENCOUNTER — Encounter: Payer: Self-pay | Admitting: Family Medicine

## 2024-12-19 ENCOUNTER — Encounter: Admitting: Family Medicine
